# Patient Record
Sex: Female | Born: 1955 | Race: Black or African American | Hispanic: No | Smoking: Never smoker
Health system: Southern US, Community
[De-identification: ages and names within clinical notes are randomized; demographics above are authoritative.]

## PROBLEM LIST (undated history)

## (undated) DIAGNOSIS — R519 Headache, unspecified: Secondary | ICD-10-CM

## (undated) DIAGNOSIS — I10 Essential (primary) hypertension: Secondary | ICD-10-CM

## (undated) DIAGNOSIS — R51 Headache: Secondary | ICD-10-CM

## (undated) DIAGNOSIS — E7800 Pure hypercholesterolemia, unspecified: Secondary | ICD-10-CM

## (undated) DIAGNOSIS — G43909 Migraine, unspecified, not intractable, without status migrainosus: Secondary | ICD-10-CM

## (undated) HISTORY — DX: Pure hypercholesterolemia, unspecified: E78.00

## (undated) HISTORY — DX: Headache, unspecified: R51.9

## (undated) HISTORY — DX: Headache: R51

## (undated) HISTORY — DX: Migraine, unspecified, not intractable, without status migrainosus: G43.909

## (undated) HISTORY — DX: Essential (primary) hypertension: I10

## (undated) HISTORY — PX: CHOLECYSTECTOMY: SHX55

---

## 1987-09-27 HISTORY — PX: OTHER SURGICAL HISTORY: SHX169

## 2012-09-26 HISTORY — PX: OTHER SURGICAL HISTORY: SHX169

## 2013-08-27 ENCOUNTER — Encounter: Payer: Self-pay | Admitting: Internal Medicine

## 2013-08-27 ENCOUNTER — Ambulatory Visit (INDEPENDENT_AMBULATORY_CARE_PROVIDER_SITE_OTHER): Payer: BC Managed Care – PPO | Admitting: Internal Medicine

## 2013-08-27 VITALS — BP 139/72 | HR 90 | Temp 98.2°F | Resp 14 | Ht 62.5 in | Wt 126.5 lb

## 2013-08-27 DIAGNOSIS — Z23 Encounter for immunization: Secondary | ICD-10-CM

## 2013-08-27 DIAGNOSIS — M549 Dorsalgia, unspecified: Secondary | ICD-10-CM

## 2013-08-27 DIAGNOSIS — I10 Essential (primary) hypertension: Secondary | ICD-10-CM

## 2013-08-27 NOTE — Progress Notes (Unsigned)
   Subjective:    Patient ID: Brandi Gutierrez, female    DOB: 06/14/1956, 57 y.o.   MRN: [id number]  HPI: Pt here for new patient visit to establish care. SHe was recently discharged from [facility] after she underwent a cholecystectomy for treatment of gallbladder pancreatitis. She states that she has been tolerating her food without any difficulty. She has only minimal pain.    Review of Systems     Objective:   Physical Exam        Assessment & Plan:  1. Back Pain: Pt manages with NSAID's. Pt states that she has been prescribed Mobic and Flexaril and  Duexis but does no take them as she does not like how it makes feel. Pt walks for exercise.  2. HTN: BP well controlled on BP meds. Verapimil. Will check U/A fro protein.   3. Itching of feet. Pt has npo sequela of systemic disease and this is likely just a non-specific iriching secondary to dry skin.  4. Immunization: Pt had a reaction to influenza vaccine and is refusing an influenza vaccine. Agrees to take TDAP  5. Health Maintenance: colonoscopy about 7 years ago and mammogram last year. No further PAP smears since Hysterectomy in 1989.  6. S/P Cholecystectomy and gallstone Pancreatitis: Pt tolerating diet well but has rapid transit diarrhea.

## 2013-09-17 ENCOUNTER — Telehealth: Payer: Self-pay | Admitting: *Deleted

## 2013-09-17 NOTE — Telephone Encounter (Signed)
Received Medical records from previous MD

## 2013-10-09 ENCOUNTER — Encounter: Payer: Self-pay | Admitting: Internal Medicine

## 2013-10-09 ENCOUNTER — Ambulatory Visit (INDEPENDENT_AMBULATORY_CARE_PROVIDER_SITE_OTHER): Payer: BC Managed Care – PPO | Admitting: Internal Medicine

## 2013-10-09 VITALS — BP 159/82 | HR 87 | Temp 98.4°F | Resp 16 | Ht 62.25 in | Wt 132.0 lb

## 2013-10-09 DIAGNOSIS — I10 Essential (primary) hypertension: Secondary | ICD-10-CM

## 2013-10-09 DIAGNOSIS — R195 Other fecal abnormalities: Secondary | ICD-10-CM

## 2013-10-09 DIAGNOSIS — R053 Chronic cough: Secondary | ICD-10-CM | POA: Insufficient documentation

## 2013-10-09 DIAGNOSIS — Z9049 Acquired absence of other specified parts of digestive tract: Secondary | ICD-10-CM

## 2013-10-09 DIAGNOSIS — R059 Cough, unspecified: Secondary | ICD-10-CM

## 2013-10-09 DIAGNOSIS — Z9089 Acquired absence of other organs: Secondary | ICD-10-CM

## 2013-10-09 DIAGNOSIS — E781 Pure hyperglyceridemia: Secondary | ICD-10-CM | POA: Insufficient documentation

## 2013-10-09 DIAGNOSIS — G894 Chronic pain syndrome: Secondary | ICD-10-CM | POA: Insufficient documentation

## 2013-10-09 DIAGNOSIS — R05 Cough: Secondary | ICD-10-CM

## 2013-10-09 MED ORDER — METOCLOPRAMIDE HCL 5 MG PO TABS: 5.0000 mg | ORAL_TABLET | Freq: Three times a day (TID) | ORAL | Status: DC

## 2013-10-09 MED ORDER — HYDROCODONE-HOMATROPINE 5-1.5 MG PO TABS: 1.0000 | ORAL_TABLET | Freq: Two times a day (BID) | ORAL | Status: DC

## 2013-10-09 NOTE — Progress Notes (Signed)
Subjective:    Patient ID: Brandi Gutierrez, female    DOB: 05/14/1956, 58 y.o.   MRN: [id number]  HPI: Pt presents today with C/O bloating, constipation and decreased caliber of stool which has been occurring for a few months. She states that the stool  Is normal consistency but she feel that she has to strain to have a BM and when she does the caliber of stool is decreased. She has no associated abdominal pain, emesis or nausea. She does feel bloated and has an excessive amount of flatus.  Pt also has complaints of cough. Pt has had chronic cough for several years and has had an extensive work-up which revealed only that she has a build-up of mucus which causes her to have a pocketing of mucus which leads to coughing and sometimes hoarseness. She has been seen by 3 ENT's and had an endoscopic evaluation both by ENT and GI without any findings. She has also had a swallow study whic was normal. She states that antihistamines make it worse. 5-1.5 mgat although she does not like to take this medication as she does not like how it makes her feel, she takes it at night so that she can sleep.   She also has chronic pain. Pt has had chronic back pain secondary to an injury at work 2 Lumbar and 1 thoracic disc. She has been in a pain clinic and was prescribed tylenol with codeine whic according was no more effective than the Duexis. She does not want to try any opiates as she has 2 siblings who were addicted to narcotics.     Review of Systems  Constitutional: Negative.   HENT: Negative.   Eyes: Negative.   Respiratory: Positive for cough.   Cardiovascular: Negative.   Gastrointestinal:       Constipation, excessive flatus and change in caliber of stools  Endocrine: Negative.   Genitourinary: Negative.   Musculoskeletal: Positive for back pain.  Skin: Negative.   Allergic/Immunologic: Negative.   Neurological: Negative.   Hematological: Negative.   Psychiatric/Behavioral: Negative.         Objective:   Physical Exam  Constitutional: She is oriented to person, place, and time. She appears well-developed and well-nourished.  HENT:  Head: Normocephalic and atraumatic.  Eyes: Conjunctivae and EOM are normal. Pupils are equal, round, and reactive to light. No scleral icterus.  Neck: Normal range of motion. Neck supple. No JVD present. No thyromegaly present.  Cardiovascular: Normal rate and regular rhythm.  Exam reveals no gallop and no friction rub.   No murmur heard. Pulmonary/Chest: Effort normal and breath sounds normal. She has no wheezes. She has no rales.  Abdominal: Soft. Bowel sounds are normal. She exhibits no distension and no mass. There is no tenderness.  Musculoskeletal: Normal range of motion.  Neurological: She is alert and oriented to person, place, and time. No cranial nerve deficit.  Skin: Skin is warm and dry.  Psychiatric: She has a normal mood and affect. Her behavior is normal. Judgment and thought content normal.          Assessment & Plan:  1. Constipation and flatus: Pt is describing decreased gut motility and change in caliber of stools. Will give a trial of reglan and also refer to GI as the pencil thin stools are a concern for partially obstructed lumen. Pt has not noted bloody or melanotic stools. Will refer to GI. Pt requesting Dr. Loreta AveMann.  2. Chronic Cough: Pt has had chronic cough for several  years and has had an extensive work-up which revealed only that she has a build-up of mucus which causes her to have a pocketing of mucus which leads to coughing and sometimes hoarseness. She has been seen by 3 ENT's and had an endoscopic evaluation both by ENT and GI without any findings. She has also had a swallow study whic was normal. She states that antihistamines make it worse. 5-1.5 mgat although she does not like to take this medication as she does not like how it makes her feel, she takes it at night so that she can sleep. Will refill Hycodan 5-1.5mg .    3.Chronic pain: Pt has had chronic back pain secondary to an injury at work 2 Lumbar and 1 thoracic disc. She has been in a pain clinic and was prescribed tylenol with codeine whic according was no more effective than the Duexis. She does not want to try any opiates as she has 2 siblings who were addicted to narcotics.   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 Pt on Lopid 600 mg. Continue. Recheck in one year  5. HTN: BP mildly elevated but patietn feels that this is related to her pain as she reports ambulatory BP to be 140/80 usually. Will continue Dyazide and recheck on next visit when pain better controlled.  RTC: 1-2 months for annual Physical examination Labs: CMET, CBC with diff 1 week before appointment Referral: GI with Dr. Loreta Ave.

## 2013-10-10 ENCOUNTER — Telehealth: Payer: Self-pay | Admitting: *Deleted

## 2013-10-10 NOTE — Telephone Encounter (Addendum)
Faxed referral to Dr. Loreta AveMann [phone] for abnormal stool ( demo/ insurance card/office notes/ orders)

## 2013-11-25 ENCOUNTER — Telehealth: Payer: Self-pay | Admitting: Internal Medicine

## 2013-11-25 DIAGNOSIS — R053 Chronic cough: Secondary | ICD-10-CM

## 2013-11-25 DIAGNOSIS — R05 Cough: Secondary | ICD-10-CM

## 2013-11-25 MED ORDER — HYDROCODONE-HOMATROPINE 5-1.5 MG PO TABS: 1.0000 | ORAL_TABLET | Freq: Two times a day (BID) | ORAL | Status: DC

## 2013-11-25 NOTE — Telephone Encounter (Signed)
Prescription filled for Hycodan (Hydrocodone-Homatropine 5-15 mg #60.

## 2013-11-27 ENCOUNTER — Ambulatory Visit: Payer: BC Managed Care – PPO | Admitting: Internal Medicine

## 2013-12-05 ENCOUNTER — Other Ambulatory Visit: Payer: Self-pay | Admitting: Internal Medicine

## 2013-12-05 ENCOUNTER — Ambulatory Visit (INDEPENDENT_AMBULATORY_CARE_PROVIDER_SITE_OTHER): Payer: BC Managed Care – PPO | Admitting: Internal Medicine

## 2013-12-05 ENCOUNTER — Encounter: Payer: Self-pay | Admitting: Internal Medicine

## 2013-12-05 VITALS — BP 157/91 | HR 114 | Temp 98.0°F | Resp 20 | Ht 62.0 in | Wt 131.0 lb

## 2013-12-05 DIAGNOSIS — Z0000 Encounter for general adult medical examination without abnormal findings: Secondary | ICD-10-CM | POA: Insufficient documentation

## 2013-12-05 DIAGNOSIS — R0600 Dyspnea, unspecified: Secondary | ICD-10-CM | POA: Insufficient documentation

## 2013-12-05 DIAGNOSIS — R9431 Abnormal electrocardiogram [ECG] [EKG]: Secondary | ICD-10-CM

## 2013-12-05 DIAGNOSIS — I10 Essential (primary) hypertension: Secondary | ICD-10-CM

## 2013-12-05 DIAGNOSIS — R0609 Other forms of dyspnea: Secondary | ICD-10-CM | POA: Insufficient documentation

## 2013-12-05 DIAGNOSIS — M519 Unspecified thoracic, thoracolumbar and lumbosacral intervertebral disc disorder: Secondary | ICD-10-CM | POA: Insufficient documentation

## 2013-12-05 DIAGNOSIS — R7309 Other abnormal glucose: Secondary | ICD-10-CM

## 2013-12-05 DIAGNOSIS — R0989 Other specified symptoms and signs involving the circulatory and respiratory systems: Secondary | ICD-10-CM

## 2013-12-05 DIAGNOSIS — E876 Hypokalemia: Secondary | ICD-10-CM

## 2013-12-05 DIAGNOSIS — R739 Hyperglycemia, unspecified: Secondary | ICD-10-CM | POA: Insufficient documentation

## 2013-12-05 DIAGNOSIS — E785 Hyperlipidemia, unspecified: Secondary | ICD-10-CM

## 2013-12-05 DIAGNOSIS — E559 Vitamin D deficiency, unspecified: Secondary | ICD-10-CM | POA: Insufficient documentation

## 2013-12-05 LAB — CBC WITH DIFFERENTIAL/PLATELET
Basophils Absolute: 0 10*3/uL (ref 0.0–0.1)
Basophils Relative: 0 % (ref 0–1)
Eosinophils Absolute: 0.1 10*3/uL (ref 0.0–0.7)
Eosinophils Relative: 2 % (ref 0–5)
HCT: 42.3 % (ref 36.0–46.0)
Hemoglobin: 15.2 g/dL — ABNORMAL HIGH (ref 12.0–15.0)
LYMPHS ABS: 2.1 10*3/uL (ref 0.7–4.0)
LYMPHS PCT: 29 % (ref 12–46)
MCH: 31.9 pg (ref 26.0–34.0)
MCHC: 35.9 g/dL (ref 30.0–36.0)
MCV: 88.7 fL (ref 78.0–100.0)
Monocytes Absolute: 0.6 10*3/uL (ref 0.1–1.0)
Monocytes Relative: 8 % (ref 3–12)
NEUTROS ABS: 4.5 10*3/uL (ref 1.7–7.7)
Neutrophils Relative %: 61 % (ref 43–77)
PLATELETS: 223 10*3/uL (ref 150–400)
RBC: 4.77 MIL/uL (ref 3.87–5.11)
RDW: 13.5 % (ref 11.5–15.5)
WBC: 7.4 10*3/uL (ref 4.0–10.5)

## 2013-12-05 LAB — HEMOGLOBIN A1C
Hgb A1c MFr Bld: 5.3 % (ref <5.7)
MEAN PLASMA GLUCOSE: 105 mg/dL (ref <117)

## 2013-12-05 NOTE — Progress Notes (Signed)
   Subjective:    Patient ID: Brandi Gutierrez, female    DOB: 10/31/1955, 58 y.o.   MRN: [id number]  HPI: Pt here for annual Physical examination. She also reports DOE and occasional SOB. She denies CP, pedal edema or orthopnea. She has no history of PAD but does have risk factors of hyperlipidemia and HTN.   Pt also reports that her back pain has increased and is now agreeable to see an orthopedic back specialist. She is requesting referral to Dr. Venita Lickahari Brooks.  She had tried to stop taking the Hycodan but the coughing has increased an had begin preventing sleep until Hycodan was resumed. The  Cough is non-productive and unchanged in quality and character from previously. She has had no F/C.  Constipation has improved since starting Linzess. Pt state that she was started on Dexilant by Dr. Loreta AveMann.     Review of Systems  Constitutional: Negative.  Negative for fever and chills.  HENT: Negative.   Eyes: Negative.   Respiratory: Positive for cough (Chronic, non productive) and shortness of breath (DOE).   Cardiovascular: Negative.  Negative for chest pain, palpitations and leg swelling.  Gastrointestinal: Negative.   Endocrine: Negative.   Genitourinary: Negative.   Musculoskeletal: Positive for back pain and myalgias.  Skin: Negative.   Allergic/Immunologic: Negative.   Neurological: Negative.  Negative for dizziness, speech difficulty, weakness and light-headedness.  Hematological: Negative.   Psychiatric/Behavioral: Negative.        Objective:   Physical Exam  Vitals reviewed. Constitutional: She is oriented to person, place, and time. She appears well-developed and well-nourished.  Breast show no masses or nipple discharge  HENT:  Head: Normocephalic and atraumatic.  Eyes: Conjunctivae and EOM are normal. Pupils are equal, round, and reactive to light.  Neck: Normal range of motion. Neck supple. No JVD present. No thyromegaly present.  Cardiovascular: Normal rate and regular  rhythm.  Exam reveals no gallop and no friction rub.   No murmur heard. Pulmonary/Chest: Effort normal and breath sounds normal. She has no wheezes. She has no rales.  Abdominal: Soft. Bowel sounds are normal. She exhibits no distension and no mass. There is no tenderness.  Musculoskeletal: Normal range of motion.  Neurological: She is alert and oriented to person, place, and time. No cranial nerve deficit.  Skin: Skin is warm and dry.  Psychiatric: She has a normal mood and affect. Her behavior is normal. Judgment and thought content normal.          Assessment & Plan:  1. HTN: Pt states that she has white coat HTN  And that BP are running 120's /80's. She will record home BP and bring them in on her next visit. Check lipid panel and 12 lead EKG   2. Constipation: Pt states that her constipation has improved with use of Linzess.   3. Hyperlipidemia: Check lipids  4. Back Pain: Chronic pain secondary to injury to lumbar and thoracic discs. Will refer to Dr. Shon BatonBrooks Orthopedic back specialist per patient request..  5. Chronic Cough: Continue Hycodan.  6. Abnormal EKG: In light of risk factors and symptoms of SOD+B and DOE will refer to Cardiology. Pt requesting Dr. Jacinto HalimGanji. Will refer.  6. SOB: Pt has DOE and occasional SOB. Her EKG shows findings consistent  7. Annual PAP and Pelvic:   Labs: CMET, Hemoglobin A1c, Lipid, TSH, CBC with diff

## 2013-12-06 LAB — COMPLETE METABOLIC PANEL WITH GFR
ALBUMIN: 4.7 g/dL (ref 3.5–5.2)
ALT: 11 U/L (ref 0–35)
AST: 21 U/L (ref 0–37)
Alkaline Phosphatase: 78 U/L (ref 39–117)
BUN: 13 mg/dL (ref 6–23)
CHLORIDE: 91 meq/L — AB (ref 96–112)
CO2: 20 mEq/L (ref 19–32)
Calcium: 9.9 mg/dL (ref 8.4–10.5)
Creat: 0.94 mg/dL (ref 0.50–1.10)
GFR, EST NON AFRICAN AMERICAN: 68 mL/min
GFR, Est African American: 78 mL/min
GLUCOSE: 91 mg/dL (ref 70–99)
Potassium: 3.4 mEq/L — ABNORMAL LOW (ref 3.5–5.3)
Sodium: 130 mEq/L — ABNORMAL LOW (ref 135–145)
Total Bilirubin: 0.4 mg/dL (ref 0.2–1.2)
Total Protein: 8.3 g/dL (ref 6.0–8.3)

## 2013-12-06 LAB — COMPREHENSIVE METABOLIC PANEL
ALBUMIN: 4.7 g/dL (ref 3.5–5.2)
ALK PHOS: 73 U/L (ref 39–117)
ALT: 13 U/L (ref 0–35)
AST: 18 U/L (ref 0–37)
BUN: 13 mg/dL (ref 6–23)
CO2: 16 mEq/L — ABNORMAL LOW (ref 19–32)
Calcium: 10.1 mg/dL (ref 8.4–10.5)
Chloride: 93 mEq/L — ABNORMAL LOW (ref 96–112)
Creat: 0.87 mg/dL (ref 0.50–1.10)
Glucose, Bld: 47 mg/dL — ABNORMAL LOW (ref 70–99)
POTASSIUM: 3 meq/L — AB (ref 3.5–5.3)
SODIUM: 137 meq/L (ref 135–145)
TOTAL PROTEIN: 8.6 g/dL — AB (ref 6.0–8.3)
Total Bilirubin: 0.4 mg/dL (ref 0.2–1.2)

## 2013-12-06 LAB — TSH
TSH: 1.517 u[IU]/mL (ref 0.350–4.500)
TSH: 1.435 u[IU]/mL (ref 0.350–4.500)

## 2013-12-06 LAB — VITAMIN D 25 HYDROXY (VIT D DEFICIENCY, FRACTURES): Vit D, 25-Hydroxy: 59 ng/mL (ref 30–89)

## 2013-12-06 MED ORDER — POTASSIUM CHLORIDE 20 MEQ PO PACK: 20.0000 meq | PACK | Freq: Two times a day (BID) | ORAL | Status: DC

## 2013-12-06 NOTE — Addendum Note (Signed)
Addended by: Marthann SchillerMATTHEWS, MICHELLE A on: 12/06/2013 02:08 PM   Modules accepted: Orders

## 2013-12-16 ENCOUNTER — Encounter: Payer: Self-pay | Admitting: Internal Medicine

## 2013-12-24 ENCOUNTER — Telehealth: Payer: Self-pay | Admitting: Internal Medicine

## 2013-12-24 DIAGNOSIS — R05 Cough: Secondary | ICD-10-CM

## 2013-12-24 DIAGNOSIS — R053 Chronic cough: Secondary | ICD-10-CM

## 2013-12-24 MED ORDER — HYDROCODONE-HOMATROPINE 5-1.5 MG PO TABS: 1.0000 | ORAL_TABLET | Freq: Two times a day (BID) | ORAL | Status: DC

## 2013-12-24 NOTE — Telephone Encounter (Signed)
Prescription given for Hicodan #60 tabs.

## 2013-12-26 ENCOUNTER — Telehealth: Payer: Self-pay | Admitting: Internal Medicine

## 2013-12-26 NOTE — Telephone Encounter (Signed)
Patient would like to know why she has been prescribed potassium chloride. Please call.

## 2014-01-13 ENCOUNTER — Other Ambulatory Visit: Payer: Self-pay | Admitting: Internal Medicine

## 2014-01-13 DIAGNOSIS — E872 Acidosis, unspecified: Secondary | ICD-10-CM

## 2014-01-14 ENCOUNTER — Telehealth: Payer: Self-pay

## 2014-01-14 ENCOUNTER — Other Ambulatory Visit: Payer: BC Managed Care – PPO

## 2014-01-14 DIAGNOSIS — E872 Acidosis, unspecified: Secondary | ICD-10-CM

## 2014-01-14 DIAGNOSIS — E876 Hypokalemia: Secondary | ICD-10-CM

## 2014-01-14 LAB — BASIC METABOLIC PANEL
BUN: 9 mg/dL (ref 6–23)
CO2: 26 mEq/L (ref 19–32)
Calcium: 9.5 mg/dL (ref 8.4–10.5)
Chloride: 95 mEq/L — ABNORMAL LOW (ref 96–112)
Creat: 1.04 mg/dL (ref 0.50–1.10)
GLUCOSE: 132 mg/dL — AB (ref 70–99)
POTASSIUM: 3.1 meq/L — AB (ref 3.5–5.3)
Sodium: 134 mEq/L — ABNORMAL LOW (ref 135–145)

## 2014-01-14 NOTE — Telephone Encounter (Signed)
01/14/2014 8:27 AM Phone (Outgoing) Brandi Gutierrez, Brandi Gutierrez (Self) [phone] (H) Left Message- Pt was contacted on (01/14/2014) and VM was left asking her to come in to have her labs performed. Pt was asked to contact front office to set up this Appointment as soon as she could.

## 2014-01-14 NOTE — Telephone Encounter (Signed)
Pt returned call to office and will be comming in for labs as requested today.

## 2014-01-15 ENCOUNTER — Other Ambulatory Visit: Payer: Self-pay | Admitting: Internal Medicine

## 2014-01-15 DIAGNOSIS — R053 Chronic cough: Secondary | ICD-10-CM

## 2014-01-15 DIAGNOSIS — R05 Cough: Secondary | ICD-10-CM

## 2014-01-16 MED ORDER — HYDROCODONE-HOMATROPINE 5-1.5 MG PO TABS: 1.0000 | ORAL_TABLET | Freq: Two times a day (BID) | ORAL | Status: DC

## 2014-01-16 MED ORDER — POTASSIUM CHLORIDE 20 MEQ PO PACK: 40.0000 meq | PACK | Freq: Two times a day (BID) | ORAL | Status: DC

## 2014-01-16 NOTE — Telephone Encounter (Signed)
Requesting refill for hydrocodone-homatropine. LOV 12/06/2013

## 2014-01-16 NOTE — Telephone Encounter (Signed)
Prescription refilled for Hycodan.

## 2014-01-17 ENCOUNTER — Telehealth: Payer: Self-pay

## 2014-01-17 ENCOUNTER — Encounter: Payer: Self-pay | Admitting: Internal Medicine

## 2014-01-17 NOTE — Telephone Encounter (Signed)
Pt contacted office and lab results were given Pt stated verbal understanding of them. Pt was alerted that she needs to make a lab appointment to be seen with in next 2 weeks.Pt was alerted to her Rx being faxed over to her local Pharm.

## 2014-01-21 ENCOUNTER — Telehealth: Payer: Self-pay | Admitting: Internal Medicine

## 2014-01-21 ENCOUNTER — Telehealth: Payer: Self-pay

## 2014-01-21 NOTE — Telephone Encounter (Signed)
Dr. Loreta AveMann has had no communication with me. Please call Dr. Kenna GilbertMann's office and ask Dr. Loreta AveMann to give me a call if she has  a recommendation that she would like to discuss.

## 2014-01-21 NOTE — Telephone Encounter (Signed)
Patient was advised by Dr. Christean LeafMann(Gastro) to get a referral from her PCP to see an ENT specialist.

## 2014-01-27 ENCOUNTER — Other Ambulatory Visit: Payer: BC Managed Care – PPO

## 2014-01-27 ENCOUNTER — Telehealth: Payer: Self-pay

## 2014-01-27 ENCOUNTER — Other Ambulatory Visit: Payer: Self-pay | Admitting: Internal Medicine

## 2014-01-27 LAB — MAGNESIUM: MAGNESIUM: 2.1 mg/dL (ref 1.5–2.5)

## 2014-01-27 NOTE — Telephone Encounter (Signed)
Pt was in office today (01/27/2014) for labs she was asked about a referal to see an ENT. Pt stated to plz discard last call for this,Pt states she no longer needs referal after all to be seen.

## 2014-01-27 NOTE — Progress Notes (Addendum)
Magnesium level drawn per order.

## 2014-01-28 ENCOUNTER — Other Ambulatory Visit: Payer: BC Managed Care – PPO

## 2014-02-14 ENCOUNTER — Other Ambulatory Visit: Payer: Self-pay | Admitting: Internal Medicine

## 2014-02-14 DIAGNOSIS — R053 Chronic cough: Secondary | ICD-10-CM

## 2014-02-14 DIAGNOSIS — R05 Cough: Secondary | ICD-10-CM

## 2014-02-18 ENCOUNTER — Telehealth: Payer: Self-pay | Admitting: Internal Medicine

## 2014-02-18 NOTE — Telephone Encounter (Signed)
Left voicemail for patient to call to confirm appointment for 02/21/14.

## 2014-02-21 ENCOUNTER — Encounter: Payer: Self-pay | Admitting: Family Medicine

## 2014-02-21 ENCOUNTER — Ambulatory Visit (INDEPENDENT_AMBULATORY_CARE_PROVIDER_SITE_OTHER): Payer: BC Managed Care – PPO | Admitting: Family Medicine

## 2014-02-21 VITALS — BP 150/100 | HR 95 | Temp 98.4°F | Resp 20 | Wt 132.0 lb

## 2014-02-21 DIAGNOSIS — R232 Flushing: Secondary | ICD-10-CM

## 2014-02-21 DIAGNOSIS — R059 Cough, unspecified: Secondary | ICD-10-CM

## 2014-02-21 DIAGNOSIS — E785 Hyperlipidemia, unspecified: Secondary | ICD-10-CM

## 2014-02-21 DIAGNOSIS — E559 Vitamin D deficiency, unspecified: Secondary | ICD-10-CM

## 2014-02-21 DIAGNOSIS — Z1231 Encounter for screening mammogram for malignant neoplasm of breast: Secondary | ICD-10-CM

## 2014-02-21 DIAGNOSIS — I10 Essential (primary) hypertension: Secondary | ICD-10-CM

## 2014-02-21 DIAGNOSIS — R053 Chronic cough: Secondary | ICD-10-CM

## 2014-02-21 DIAGNOSIS — R05 Cough: Secondary | ICD-10-CM

## 2014-02-21 DIAGNOSIS — N951 Menopausal and female climacteric states: Secondary | ICD-10-CM

## 2014-02-21 MED ORDER — ESTROGENS CONJUGATED 0.625 MG PO TABS: 0.6250 mg | ORAL_TABLET | Freq: Every day | ORAL | Status: DC

## 2014-02-21 MED ORDER — HYDROCODONE-HOMATROPINE 5-1.5 MG PO TABS: 1.0000 | ORAL_TABLET | Freq: Two times a day (BID) | ORAL | Status: DC

## 2014-02-21 NOTE — Progress Notes (Signed)
   Subjective:    Patient ID: Laresa Cramblit, female    DOB: 08-Jan-1956, 58 y.o.   MRN: [id number]  HPI  Pt also has complaints chronic cough. Pt has had chronic cough for twenty years and has had an extensive work-up that revealed mucous pocketing that leads to a chronic cough and hoarseness. She has been seen by 3 ENT's and is currently followed by Dr. Christell Constant at [facility]. She has also had an endoscopic evaluation by both ENT and Dr. Loreta Ave, GI, with negative findings. Cough is worsened by antihistamines and minimally relieved by Hycodan twice daily, last taken on 02/20/2014. S    Review of Systems  Constitutional: Negative for diaphoresis.  HENT: Negative.   Eyes: Negative.   Respiratory: Positive for cough and choking.   Cardiovascular: Negative.   Gastrointestinal: Negative.   Endocrine: Negative.   Genitourinary: Negative.   Musculoskeletal: Negative.   Skin: Negative.   Allergic/Immunologic: Negative.   Neurological: Negative.   Hematological: Negative.   Psychiatric/Behavioral: Negative.        Objective:   Physical Exam  Constitutional: She is oriented to person, place, and time. She appears well-developed and well-nourished.  HENT:  Head: Normocephalic and atraumatic.  Right Ear: External ear normal.  Eyes: Conjunctivae are normal. Pupils are equal, round, and reactive to light.  Neck: Normal range of motion. Neck supple.  Cardiovascular: Normal rate, regular rhythm and normal heart sounds.   Pulmonary/Chest: Effort normal and breath sounds normal. No apnea and not tachypneic. No respiratory distress. She has no decreased breath sounds. She has no rhonchi.  Abdominal: Soft. Bowel sounds are normal.  Musculoskeletal: Normal range of motion.  Neurological: She is alert and oriented to person, place, and time.  Skin: Skin is warm and dry.          Assessment & Plan:   1. Chronic Cough: Pt complaining of chronic cough for several years and has had several  extensive work-ups. Work-ups have shown that she has a build-up of mucus which causes her to have a pocketing of mucus which leads to chronic cough. She has been seen by three ENT's and is currently followed by Dr. Christell Constant at [facility]. She had an endoscopic evaluation both by ENT and recently by Dr. Loreta Ave, GI without any findings. In addition, there was a swallow study which was within normal limits. Patient reports that antihistamines have failed in the past and have made cough symptoms worse. Re-ordered Hycodan 5-1.5 mg, which is the only medication that has decreased persistent coughing.    2. Hypertriglycerindemia:  Continue current medication regimen   3. HTN: BP stable on current medication regimen. BP mildly elevated during appt. Patient has been experiencing coughing during both manual and automatic bp checks.   4. Hot flashes:  Patient is post menopausal and has a history of hot flashes. Will continue Premarin as previously prescribed. Reinforced education on potential side effects of Premarin.  5. Vitamin D deficiency: Continue Vitamin D tablets 1000 units twice daily.   Preventative: Reports that it has been greater than 1 year since last mammogram. Will refer for screening mammogram  Massie Maroon

## 2014-02-24 NOTE — Telephone Encounter (Signed)
Medication refill request for Hydrocodone-Homatropine 5-1.5 MG TABS / LOV 02/21/2014

## 2014-02-26 DIAGNOSIS — R232 Flushing: Secondary | ICD-10-CM | POA: Insufficient documentation

## 2014-02-26 DIAGNOSIS — Z1231 Encounter for screening mammogram for malignant neoplasm of breast: Secondary | ICD-10-CM | POA: Insufficient documentation

## 2014-03-03 ENCOUNTER — Telehealth: Payer: Self-pay

## 2014-03-03 NOTE — Telephone Encounter (Signed)
Pt has Appointment per NP request[facility] Routine Mamo.

## 2014-03-10 ENCOUNTER — Ambulatory Visit: Payer: BC Managed Care – PPO | Admitting: Internal Medicine

## 2014-03-11 ENCOUNTER — Telehealth: Payer: Self-pay

## 2014-03-11 ENCOUNTER — Ambulatory Visit
Admission: RE | Admit: 2014-03-11 | Discharge: 2014-03-11 | Disposition: A | Discharge status: Home / Self Care | Payer: Federal, State, Local not specified - PPO | Source: Ambulatory Visit | Attending: Family Medicine | Admitting: Family Medicine

## 2014-03-11 DIAGNOSIS — Z1231 Encounter for screening mammogram for malignant neoplasm of breast: Secondary | ICD-10-CM

## 2014-03-11 NOTE — Telephone Encounter (Signed)
Pt contacted office today stating she was having nausa issues as well as loose stool isssues.Pt stated she had not been able to keep anything down except for crackers.When asked how long Sx's had been ongoing Pt stated x's 1 wk. Pt stated she went to Urgent care on Friday 03/07/2014 for this.Pt states she had a mild headache but B/P was 129/82 as of today.Pt was encourged to come in to be seen by NP on tomorrow,Pt refused and wanted to see MD only.Pt made appointment for 03/13/2014@ 3P.M..Before hanging up w/ Pt she was again advised of an appointment she could have as soon as tomorrow,Again she refused.

## 2014-03-12 ENCOUNTER — Other Ambulatory Visit: Payer: Self-pay | Admitting: Internal Medicine

## 2014-03-12 ENCOUNTER — Telehealth: Payer: Self-pay

## 2014-03-12 ENCOUNTER — Encounter: Payer: Self-pay | Admitting: Internal Medicine

## 2014-03-12 ENCOUNTER — Ambulatory Visit (INDEPENDENT_AMBULATORY_CARE_PROVIDER_SITE_OTHER): Payer: Federal, State, Local not specified - PPO | Admitting: Internal Medicine

## 2014-03-12 VITALS — BP 143/71 | HR 100 | Temp 98.0°F | Resp 20 | Wt 127.0 lb

## 2014-03-12 DIAGNOSIS — F3289 Other specified depressive episodes: Secondary | ICD-10-CM

## 2014-03-12 DIAGNOSIS — R51 Headache: Secondary | ICD-10-CM

## 2014-03-12 DIAGNOSIS — R112 Nausea with vomiting, unspecified: Secondary | ICD-10-CM

## 2014-03-12 DIAGNOSIS — R519 Headache, unspecified: Secondary | ICD-10-CM | POA: Insufficient documentation

## 2014-03-12 DIAGNOSIS — R42 Dizziness and giddiness: Secondary | ICD-10-CM

## 2014-03-12 DIAGNOSIS — F329 Major depressive disorder, single episode, unspecified: Secondary | ICD-10-CM

## 2014-03-12 DIAGNOSIS — F32A Depression, unspecified: Secondary | ICD-10-CM | POA: Insufficient documentation

## 2014-03-12 LAB — CBC WITH DIFFERENTIAL/PLATELET
BASOS ABS: 0 10*3/uL (ref 0.0–0.1)
Basophils Relative: 0 % (ref 0–1)
EOS PCT: 2 % (ref 0–5)
Eosinophils Absolute: 0.2 10*3/uL (ref 0.0–0.7)
HCT: 43.9 % (ref 36.0–46.0)
Hemoglobin: 15.8 g/dL — ABNORMAL HIGH (ref 12.0–15.0)
LYMPHS PCT: 28 % (ref 12–46)
Lymphs Abs: 2.3 10*3/uL (ref 0.7–4.0)
MCH: 32.1 pg (ref 26.0–34.0)
MCHC: 36 g/dL (ref 30.0–36.0)
MCV: 89.2 fL (ref 78.0–100.0)
Monocytes Absolute: 0.6 10*3/uL (ref 0.1–1.0)
Monocytes Relative: 8 % (ref 3–12)
NEUTROS PCT: 62 % (ref 43–77)
Neutro Abs: 5 10*3/uL (ref 1.7–7.7)
PLATELETS: 211 10*3/uL (ref 150–400)
RBC: 4.92 MIL/uL (ref 3.87–5.11)
RDW: 13.7 % (ref 11.5–15.5)
WBC: 8.1 10*3/uL (ref 4.0–10.5)

## 2014-03-12 MED ORDER — DULOXETINE HCL 30 MG PO CPEP: 30.0000 mg | ORAL_CAPSULE | Freq: Every day | ORAL | Status: DC

## 2014-03-12 NOTE — Telephone Encounter (Signed)
Pt's [facility] [address] [place] [address] Ph#[phone] Sat.03/15/2014@3  P.M. Pt has been notified of date as well as time.

## 2014-03-12 NOTE — Progress Notes (Signed)
Subjective:    Patient ID: Brandi Gutierrez, female    DOB: 04/21/1956, 58 y.o.   MRN: [id number]  HPI: Pt presents with onset of constant headache  Of fluctuating intensity x 1 week. She describes the H/A as throbbing and localized to the crown of her head and radiating down right side of neck. H/A at it's worst is 9/10 and is currently 8/10. She cannot identify any palliative factor but H/A is worse with coughing. H/A is associated with nausea which came on 1 day after the H/A started. The nausea adn headaches are constant and intensifies with cough or upright position. Pt does have a history of headaches but reports that this is unlike her usual migraines.   Pt also having  Post-prandial emesis associated with solid meals. She is able to tolerate liquids without difficulty. Her last meal was yesterday and so was her last emesis.    Pt also having dizziness which is constant and patient feels as though she is spinning. Pt denies any tinnitus, disequilibrium, photophobia or changes in vision.  Pt was seen in an Urgent Care and found to have an elevated BP. She was  given a new BP medication and discontinued Verapamil. She is still taking Dyazide.  Pt continues to complain about cough. She states that the cough has worsened in the last month. Pt self referred to an ENT on the advice of Dr. Loreta AveMann. Pt had already had 3 thorough evaluations in the past. She reports that the ENT had no new findings and concluded that it is related to her GERD. On further probing, patient was able to give an exact date of the start of her cough, but was unable to give the date of her divorce. She expresses grief over the loss of her livelihood and he independence. She is currently living with her sister and is unsure that she is welcome in her sister's home although she has had no indication that she is unwelcome.   Review of Systems  HENT: Negative for congestion, ear discharge, hearing loss, sinus pressure and tinnitus.    Eyes: Negative for photophobia and visual disturbance.  Respiratory: Negative for shortness of breath and wheezing.   Cardiovascular: Negative for palpitations.  Gastrointestinal: Positive for nausea and vomiting.  Endocrine: Negative for cold intolerance and heat intolerance.  Genitourinary: Negative for urgency.  Musculoskeletal: Positive for neck pain. Negative for neck stiffness.       Objective:   Physical Exam  Constitutional: She is oriented to person, place, and time. She appears well-developed and well-nourished.  HENT:  Head: Normocephalic and atraumatic.  Eyes: Conjunctivae and EOM are normal. Pupils are equal, round, and reactive to light. No scleral icterus.  Neck: Normal range of motion. Neck supple. No JVD present. No thyromegaly present.  Cardiovascular: Normal rate and regular rhythm.  Exam reveals no gallop and no friction rub.   No murmur heard. Pulmonary/Chest: Effort normal and breath sounds normal. She has no wheezes. She has no rales.  Abdominal: Soft. Bowel sounds are normal. She exhibits no distension and no mass. There is no tenderness.  Musculoskeletal: Normal range of motion.  Neurological: She is alert and oriented to person, place, and time. She has normal reflexes. No cranial nerve deficit. She exhibits normal muscle tone. Coordination normal.  No nystagmus present  Skin: Skin is warm and dry.  Psychiatric: She has a normal mood and affect. Her behavior is normal. Judgment and thought content normal.  Assessment & Plan:  1. Headaches: The headache is associated with nausea and emesis and also worsens with cough and change in position. These associated features raise the suspicion for brain tumor despite the absence of focal neurological changes. A review of prior MRI brain showed a small well defined T2/FLAIR signal in the frontal   I will obtain a MRI brain to ensure no intracranial lesions.  2.Dizziness: Non-specific.  Ms. Mayford KnifeWilliams has no  focal deficits and her dizziness is nonspecific. I personally performed orthostatic vital signs and they were negative. I will check her electrolytes and Hb/Hct to ensure no abnormalities that could contribute. I do feel that she is depressed and would benefit from psychotherapy. The dizziness is not interfering with her ADL's or daily activity (she drove herself here).   3. Nausea/Vomiting: She does not appear to be dehydrated clinically. She states that the Reglan help only minimally. Will prescribe Zofran as Phenergan would increase likeliness of extrapyramidal symptoms.   4. Depression: Pt has mild depression. Will start on Cymbalta and refer for counseling. Advised patient of side effects. Will re-assess in one month.  5. HTN: BP just only mildly elevated today. Will continue on cartia-XT.  Labs: CMET, Mg, CBC with diff  RTC 1 month.

## 2014-03-12 NOTE — Telephone Encounter (Signed)
Pt was contacted and VM was left asking her to call office with responce on if she is able to come in and be seen as of this morning per request of Dr. Ashley RoyaltyMatthews.

## 2014-03-13 ENCOUNTER — Telehealth: Payer: Self-pay

## 2014-03-13 ENCOUNTER — Ambulatory Visit: Payer: Federal, State, Local not specified - PPO | Admitting: Internal Medicine

## 2014-03-13 ENCOUNTER — Telehealth: Payer: Self-pay | Admitting: Internal Medicine

## 2014-03-13 LAB — COMPREHENSIVE METABOLIC PANEL
ALBUMIN: 4.7 g/dL (ref 3.5–5.2)
ALT: 10 U/L (ref 0–35)
AST: 15 U/L (ref 0–37)
Alkaline Phosphatase: 89 U/L (ref 39–117)
BILIRUBIN TOTAL: 0.6 mg/dL (ref 0.2–1.2)
BUN: 13 mg/dL (ref 6–23)
CHLORIDE: 92 meq/L — AB (ref 96–112)
CO2: 26 meq/L (ref 19–32)
Calcium: 10.4 mg/dL (ref 8.4–10.5)
Creat: 1.08 mg/dL (ref 0.50–1.10)
Glucose, Bld: 115 mg/dL — ABNORMAL HIGH (ref 70–99)
POTASSIUM: 3.1 meq/L — AB (ref 3.5–5.3)
SODIUM: 134 meq/L — AB (ref 135–145)
TOTAL PROTEIN: 8.3 g/dL (ref 6.0–8.3)

## 2014-03-13 LAB — MAGNESIUM: MAGNESIUM: 2 mg/dL (ref 1.5–2.5)

## 2014-03-13 NOTE — Telephone Encounter (Signed)
Call Documentation     Wende NeighborsCharlene D Thomas at 03/13/2014 3:23 PM     Status: Signed        Myrtice LauthKeith Funderburk with [employer] called regarding patient referral. He can be reached at [phone]   K. Funderburk returned call to office regarding above named Pt.Mr. Lennette BihariFunderburk states he will be reaching out to Ms. Hubers sometime this Evening to speak about getting her into the program.Pt was contacted and VM was left leaving info of impending call.

## 2014-03-13 NOTE — Telephone Encounter (Signed)
Myrtice LauthKeith Funderburk with [employer] called regarding patient referral. He can be reached at [phone].

## 2014-03-14 ENCOUNTER — Telehealth: Payer: Self-pay | Admitting: Internal Medicine

## 2014-03-14 DIAGNOSIS — R05 Cough: Secondary | ICD-10-CM

## 2014-03-14 DIAGNOSIS — R053 Chronic cough: Secondary | ICD-10-CM

## 2014-03-14 MED ORDER — HYDROCODONE-HOMATROPINE 5-1.5 MG PO TABS: 1.0000 | ORAL_TABLET | Freq: Three times a day (TID) | ORAL | Status: DC | PRN

## 2014-03-14 NOTE — Telephone Encounter (Signed)
Prescription filled for Hycodan #90 tabs.

## 2014-03-15 ENCOUNTER — Ambulatory Visit (HOSPITAL_BASED_OUTPATIENT_CLINIC_OR_DEPARTMENT_OTHER)
Admission: RE | Admit: 2014-03-15 | Discharge: 2014-03-15 | Disposition: A | Discharge status: Home / Self Care | Payer: Federal, State, Local not specified - PPO | Source: Ambulatory Visit | Attending: Internal Medicine | Admitting: Internal Medicine

## 2014-03-15 DIAGNOSIS — R519 Headache, unspecified: Secondary | ICD-10-CM

## 2014-03-15 DIAGNOSIS — R51 Headache: Secondary | ICD-10-CM | POA: Insufficient documentation

## 2014-03-15 MED ORDER — GADOBENATE DIMEGLUMINE 529 MG/ML IV SOLN: 10.0000 mL | Freq: Once | INTRAVENOUS | Status: AC | PRN

## 2014-03-17 NOTE — Progress Notes (Signed)
Patient ID: Brandi DistanceKathleen Standing, female   DOB: 12/29/1955, 58 y.o.   MRN: [id number] MRI reviewed and no suspicious findings noted. Pt however continues to have hypokalemia will continue with replacement but will proceed with TTKG measurement when she returns from vacation.

## 2014-04-01 ENCOUNTER — Telehealth: Payer: Self-pay | Admitting: Internal Medicine

## 2014-04-01 NOTE — Telephone Encounter (Signed)
Left voicemail for patient advising of appointment date change to 04/23/14, 4:15pm.

## 2014-04-04 ENCOUNTER — Other Ambulatory Visit: Payer: Self-pay | Admitting: Internal Medicine

## 2014-04-10 ENCOUNTER — Ambulatory Visit: Payer: Federal, State, Local not specified - PPO | Admitting: Internal Medicine

## 2014-04-21 ENCOUNTER — Telehealth: Payer: Self-pay | Admitting: Internal Medicine

## 2014-04-21 DIAGNOSIS — R05 Cough: Secondary | ICD-10-CM

## 2014-04-21 DIAGNOSIS — R053 Chronic cough: Secondary | ICD-10-CM

## 2014-04-21 MED ORDER — HYDROCODONE-HOMATROPINE 5-1.5 MG PO TABS: 1.0000 | ORAL_TABLET | Freq: Three times a day (TID) | ORAL | Status: DC | PRN

## 2014-04-21 MED ORDER — DILTIAZEM HCL ER COATED BEADS 300 MG PO CP24: 300.0000 mg | ORAL_CAPSULE | Freq: Every day | ORAL | Status: DC

## 2014-04-21 NOTE — Telephone Encounter (Signed)
Refill Request for Hydrocodone-Homatropine. LOV 03/12/2014.   I have refilled the Cardizem and sent to Pharmacy.

## 2014-04-21 NOTE — Telephone Encounter (Signed)
Prescription filled for Hycodan #90 tabs. 

## 2014-04-22 ENCOUNTER — Other Ambulatory Visit: Payer: Self-pay | Admitting: Internal Medicine

## 2014-04-22 DIAGNOSIS — R053 Chronic cough: Secondary | ICD-10-CM

## 2014-04-22 DIAGNOSIS — R05 Cough: Secondary | ICD-10-CM

## 2014-04-22 NOTE — Telephone Encounter (Signed)
Pt requesting a refill on Hycodan. LOV 03/12/2014. Please advise. Thanks!

## 2014-04-22 NOTE — Telephone Encounter (Signed)
Already completed. Check in the drawer.

## 2014-04-22 NOTE — Telephone Encounter (Signed)
Cannot be electronically sent it has Hydrocodone in it (a Schedule III drug)

## 2014-04-23 ENCOUNTER — Ambulatory Visit: Payer: Federal, State, Local not specified - PPO | Admitting: Internal Medicine

## 2014-05-22 ENCOUNTER — Ambulatory Visit (INDEPENDENT_AMBULATORY_CARE_PROVIDER_SITE_OTHER): Payer: Federal, State, Local not specified - PPO | Admitting: Internal Medicine

## 2014-05-22 VITALS — BP 154/69 | HR 84 | Temp 98.2°F | Resp 14 | Ht 62.0 in | Wt 128.0 lb

## 2014-05-22 DIAGNOSIS — R059 Cough, unspecified: Secondary | ICD-10-CM

## 2014-05-22 DIAGNOSIS — R7309 Other abnormal glucose: Secondary | ICD-10-CM

## 2014-05-22 DIAGNOSIS — R053 Chronic cough: Secondary | ICD-10-CM

## 2014-05-22 DIAGNOSIS — E876 Hypokalemia: Secondary | ICD-10-CM

## 2014-05-22 DIAGNOSIS — R05 Cough: Secondary | ICD-10-CM

## 2014-05-22 DIAGNOSIS — R739 Hyperglycemia, unspecified: Secondary | ICD-10-CM

## 2014-05-22 DIAGNOSIS — I10 Essential (primary) hypertension: Secondary | ICD-10-CM

## 2014-05-22 DIAGNOSIS — B029 Zoster without complications: Secondary | ICD-10-CM

## 2014-05-22 LAB — HEMOGLOBIN A1C
Hgb A1c MFr Bld: 5.5 % (ref <5.7)
Mean Plasma Glucose: 111 mg/dL (ref <117)

## 2014-05-22 MED ORDER — HYDROCODONE-HOMATROPINE 5-1.5 MG PO TABS: 1.0000 | ORAL_TABLET | Freq: Three times a day (TID) | ORAL | Status: DC | PRN

## 2014-05-22 MED ORDER — DILTIAZEM HCL ER COATED BEADS 300 MG PO CP24: 300.0000 mg | ORAL_CAPSULE | Freq: Every day | ORAL | Status: DC

## 2014-05-22 MED ORDER — VALACYCLOVIR HCL 1 G PO TABS: 1000.0000 mg | ORAL_TABLET | Freq: Every day | ORAL | Status: DC

## 2014-05-22 NOTE — Progress Notes (Signed)
Patient ID: Brandi Gutierrez, female   DOB: 1956/06/24, 58 y.o.   MRN: [id number]   Brandi Gutierrez, is a 58 y.o. female  [id number]  [id number]  DOB - 04/11/1956  CC:  Chief Complaint  Patient presents with  . Follow-up       HPI: Brandi Gutierrez is a 58 y.o. female here today to follow up on HTN and Hypokalemia and nausea. On last visit I requested a food diary and patient reports that "sweets" had a strong temporal relationship to the nausea. Thus she has eliminated "sweets" from her diet and this has resolved the nausea. Pt has also found a church which she likes and she feels better adjusted since doing so.   She also reports that she has been taking Valtrex for Herpes but was ashamed to tell me before.    Patient has No headache, No chest pain, No abdominal pain - No Nausea, No new weakness tingling or numbness, No Cough - SOB.  Allergies  Allergen Reactions  . Fish Allergy Hives, Shortness Of Breath and Swelling   No past medical history on file. Current Outpatient Prescriptions on File Prior to Visit  Medication Sig Dispense Refill  . Calcium Carbonate-Vit D-Min (CALCIUM 1200 PO) Take 1,200 mg by mouth once.      . cholecalciferol (VITAMIN D) 1000 UNITS tablet Take 1,000 Units by mouth 2 (two) times daily.      . DULoxetine (CYMBALTA) 30 MG capsule TAKE ONE CAPSULE BY MOUTH EVERY DAY  90 capsule  1  . DULoxetine (CYMBALTA) 30 MG capsule Take 30 mg by mouth daily.      Marland Kitchen estrogens, conjugated, (PREMARIN) 0.625 MG tablet Take 1 tablet (0.625 mg total) by mouth daily. Take daily for 21 days then do not take for 7 days.  30 tablet  2  . Flaxseed, Linseed, (FLAX SEED OIL PO) Take 1,200 capsules by mouth 2 (two) times daily.      Marland Kitchen gemfibrozil (LOPID) 600 MG tablet Take 600 mg by mouth 2 (two) times daily.      . metoCLOPramide (REGLAN) 5 MG tablet Take 1 tablet (5 mg total) by mouth 3 (three) times daily before meals.  90 tablet  0  . OIL OF OREGANO PO Take 45 mg by  mouth once.      Marland Kitchen omeprazole (PRILOSEC) 40 MG capsule Take 40 mg by mouth daily.      . potassium chloride (KLOR-CON) 20 MEQ packet Take 40 mEq by mouth 2 (two) times daily.  120 tablet  3  . triamterene-hydrochlorothiazide (DYAZIDE) 37.5-25 MG per capsule Take 1 capsule by mouth daily.       No current facility-administered medications on file prior to visit.   No family history on file. History   Social History  . Marital Status: Divorced    Spouse Name: N/A    Number of Children: N/A  . Years of Education: N/A   Occupational History  . Not on file.   Social History Main Topics  . Smoking status: Never Smoker   . Smokeless tobacco: Not on file  . Alcohol Use: No  . Drug Use: Not on file  . Sexual Activity: Not on file   Other Topics Concern  . Not on file   Social History Narrative  . No narrative on file    Review of Systems: Constitutional: Negative for fever, chills, diaphoresis, activity change, appetite change and fatigue. HENT: Negative for ear pain, nosebleeds, congestion, facial swelling, rhinorrhea, neck pain, neck  stiffness and ear discharge.  Eyes: Negative for pain, discharge, redness, itching and visual disturbance. Respiratory: Negative for cough, choking, chest tightness, shortness of breath, wheezing and stridor.  Cardiovascular: Negative for chest pain, palpitations and leg swelling. Gastrointestinal: Negative for abdominal distention. Genitourinary: Negative for dysuria, urgency, frequency, hematuria, flank pain, decreased urine volume, difficulty urinating and dyspareunia.  Musculoskeletal: Negative for back pain, joint swelling, arthralgia and gait problem. Neurological: Negative for dizziness, tremors, seizures, syncope, facial asymmetry, speech difficulty, weakness, light-headedness, numbness and headaches.  Hematological: Negative for adenopathy. Does not bruise/bleed easily. Psychiatric/Behavioral: Negative for hallucinations, behavioral  problems, confusion, dysphoric mood, decreased concentration and agitation.    Objective:   Filed Vitals:   05/22/14 0841  BP: 154/69  Pulse: 84  Temp: 98.2 F (36.8 C)  Resp: 14    Physical Exam: Constitutional: Patient appears well-developed and well-nourished. No distress. HENT: Normocephalic, atraumatic, External right and left ear normal. Oropharynx is clear and moist.  Eyes: Conjunctivae and EOM are normal. PERRLA, no scleral icterus. Neck: Normal ROM. Neck supple. No JVD. No tracheal deviation. No thyromegaly. CVS: RRR, S1/S2 +, no murmurs, no gallops, no carotid bruit.  Pulmonary: Effort and breath sounds normal, no stridor, rhonchi, wheezes, rales.  Abdominal: Soft. BS +, no distension, tenderness, rebound or guarding.  Musculoskeletal: Normal range of motion. No edema and no tenderness.  Lymphadenopathy: No lymphadenopathy noted, cervical, inguinal or axillary Neuro: Alert. Normal reflexes, muscle tone coordination. No cranial nerve deficit. Skin: Skin is warm and dry. No rash noted. Not diaphoretic. No erythema. No pallor. Psychiatric: Normal mood and affect. Behavior, judgment, thought content normal.  Lab Results  Component Value Date   WBC 8.1 03/12/2014   HGB 15.8* 03/12/2014   HCT 43.9 03/12/2014   MCV 89.2 03/12/2014   PLT 211 03/12/2014   Lab Results  Component Value Date   CREATININE 1.08 03/12/2014   BUN 13 03/12/2014   NA 134* 03/12/2014   K 3.1* 03/12/2014   CL 92* 03/12/2014   CO2 26 03/12/2014    Lab Results  Component Value Date   HGBA1C 5.3 12/05/2013   Lipid Panel  No results found for this basename: chol, trig, hdl, cholhdl, vldl, ldlcalc       Assessment and plan:   1. Herpes zoster - Pt on suppression dose Valtrex. - Valacyclovir (VALTREX) 1000 MG tablet; Take 1 tablet (1,000 mg total) by mouth daily.  Dispense: 90 tablet; Refill: 3  2. Essential hypertension - Pt reports that her BP is usually in the range of 130/85 at home. I have  asked patient to keep a log of her BP at home and then bring her log and BP cuff in for comparison. I will leave her dosing as is until the BP log is reviewed (per pat request). - diltiazem (CARDIZEM CD) 300 MG 24 hr capsule; Take 1 capsule (300 mg total) by mouth daily.  Dispense: 30 capsule; Refill: 3  3. Hypokalemia - Etiology unclear. This could be secondary to diuretic. Will check potassium today and if still decreased despite supplements will consider changing to Spironolactone or referring to Nephrology to evaluate for renal wasting of potassium - Basic Metabolic Panel  4. Hyperglycemia - Hemoglobin A1C  5. Chronic cough - Hydrocodone-Homatropine 5-1.5 MG TABS; Take 1 tablet by mouth 3 (three) times daily as needed.  Dispense: 90 each; Refill: 0  6. Immunizations - Pt refused Influenza vaccine and states that she does not accept the vaccine.   No Follow-up on file.  The patient was given clear instructions to go to ER or return to medical center if symptoms don't improve, worsen or new problems develop. The patient verbalized understanding. The patient was told to call to get lab results if they haven't heard anything in the next week.     This note has been created with Education officer, environmental. Any transcriptional errors are unintentional.    MATTHEWS,MICHELLE A., MD [facility] [place], [place] [phone]   05/22/2014, 9:15 AM

## 2014-05-23 LAB — BASIC METABOLIC PANEL
BUN: 10 mg/dL (ref 6–23)
CALCIUM: 9.4 mg/dL (ref 8.4–10.5)
CO2: 27 meq/L (ref 19–32)
Chloride: 100 mEq/L (ref 96–112)
Creat: 0.9 mg/dL (ref 0.50–1.10)
GLUCOSE: 108 mg/dL — AB (ref 70–99)
Potassium: 3 mEq/L — ABNORMAL LOW (ref 3.5–5.3)
Sodium: 140 mEq/L (ref 135–145)

## 2014-06-01 ENCOUNTER — Other Ambulatory Visit: Payer: Self-pay | Admitting: Internal Medicine

## 2014-06-16 ENCOUNTER — Telehealth: Payer: Self-pay | Admitting: Internal Medicine

## 2014-06-16 DIAGNOSIS — R053 Chronic cough: Secondary | ICD-10-CM

## 2014-06-16 DIAGNOSIS — R05 Cough: Secondary | ICD-10-CM

## 2014-06-16 MED ORDER — HYDROCODONE-HOMATROPINE 5-1.5 MG PO TABS: 1.0000 | ORAL_TABLET | Freq: Three times a day (TID) | ORAL | Status: DC | PRN

## 2014-06-16 NOTE — Telephone Encounter (Signed)
Refill request for Hydrocodone-homatropine 5-1.5mg . LOV 05/22/2014. Please advise. Thanks!

## 2014-06-16 NOTE — Telephone Encounter (Signed)
Prescription filled for Hycodan #90 tabs. 

## 2014-06-23 ENCOUNTER — Ambulatory Visit (INDEPENDENT_AMBULATORY_CARE_PROVIDER_SITE_OTHER): Payer: Federal, State, Local not specified - PPO | Admitting: Internal Medicine

## 2014-06-23 ENCOUNTER — Other Ambulatory Visit: Payer: Self-pay | Admitting: Internal Medicine

## 2014-06-23 ENCOUNTER — Encounter: Payer: Self-pay | Admitting: Internal Medicine

## 2014-06-23 VITALS — BP 164/72 | HR 84 | Temp 98.5°F | Resp 14 | Ht 62.0 in | Wt 127.0 lb

## 2014-06-23 DIAGNOSIS — R51 Headache: Secondary | ICD-10-CM

## 2014-06-23 DIAGNOSIS — I10 Essential (primary) hypertension: Secondary | ICD-10-CM

## 2014-06-23 DIAGNOSIS — R519 Headache, unspecified: Secondary | ICD-10-CM

## 2014-06-23 DIAGNOSIS — T50905A Adverse effect of unspecified drugs, medicaments and biological substances, initial encounter: Secondary | ICD-10-CM

## 2014-06-23 DIAGNOSIS — Z23 Encounter for immunization: Secondary | ICD-10-CM

## 2014-06-23 DIAGNOSIS — E876 Hypokalemia: Secondary | ICD-10-CM

## 2014-06-23 MED ORDER — LISINOPRIL 10 MG PO TABS: 10.0000 mg | ORAL_TABLET | Freq: Every day | ORAL | Status: DC

## 2014-06-23 NOTE — Progress Notes (Signed)
Patient ID: Brandi Gutierrez, female   DOB: May 17, 1956, 58 y.o.   MRN: [id number]   Brandi Gutierrez, is a 58 y.o. female  [id number]  [id number]  DOB - 20-Jan-1956  CC:  Chief Complaint  Patient presents with  . Follow-up       HPI: Brandi Gutierrez is a 58 y.o. female here today to follow up on hypokalemia, HTN, Headache and Chronic back pain. SHe reports that she has been seeing a [facility] and has been seen 1 x week for the last 4 weeks. She feels that her pain is improved with the chiropractic adjustments.   She also reports that she has had no improvement in her HA which are chronic. She had an MRI done in June 2015 which showed no abnormalities of the brian. She describes HA as located at the top of her head and sometimes in hte retro-orbital area. The pain is throbbing and sharp in nature and sometimes associated with nausea and rarely with emesis. It varies in it's time of day of occuarance and it varies in duration. She is unable to identify any associated palliative or provocative features.   Pt also has chronic back pain and is requesting a suggestion to an Orthopedic Back Specialist for updating her disability state.   Pt reports that she has been compliant with taking her potassium replacement. She was supposed to have her labs done prior to her appointment but forgot.  Patient has  No chest pain, No abdominal pain - No Nausea, No new weakness tingling or numbness, No Cough - SOB.  Allergies  Allergen Reactions  . Fish Allergy Hives, Shortness Of Breath and Swelling   No past medical history on file. Current Outpatient Prescriptions on File Prior to Visit  Medication Sig Dispense Refill  . Calcium Carbonate-Vit D-Min (CALCIUM 1200 PO) Take 1,200 mg by mouth once.      . cholecalciferol (VITAMIN D) 1000 UNITS tablet Take 1,000 Units by mouth 2 (two) times daily.      Marland Kitchen diltiazem (CARDIZEM CD) 300 MG 24 hr capsule Take 1 capsule (300 mg total) by mouth daily.   30 capsule  3  . DULoxetine (CYMBALTA) 30 MG capsule TAKE ONE CAPSULE BY MOUTH EVERY DAY  90 capsule  1  . DULoxetine (CYMBALTA) 30 MG capsule Take 30 mg by mouth daily.      . Flaxseed, Linseed, (FLAX SEED OIL PO) Take 1,200 capsules by mouth 2 (two) times daily.      Marland Kitchen gemfibrozil (LOPID) 600 MG tablet Take 600 mg by mouth 2 (two) times daily.      . Hydrocodone-Homatropine 5-1.5 MG TABS Take 1 tablet by mouth 3 (three) times daily as needed.  90 each  0  . metoCLOPramide (REGLAN) 5 MG tablet Take 1 tablet (5 mg total) by mouth 3 (three) times daily before meals.  90 tablet  0  . OIL OF OREGANO PO Take 45 mg by mouth once.      Marland Kitchen omeprazole (PRILOSEC) 40 MG capsule Take 40 mg by mouth daily.      . potassium chloride (KLOR-CON) 20 MEQ packet Take 40 mEq by mouth 2 (two) times daily.  120 tablet  3  . PREMARIN 0.625 MG tablet TAKE 1 TABLET BY MOUTH DAILY FOR 21 DAYS, THEN DO NOT TAKE FOR 7 DAYS  30 tablet  0  . triamterene-hydrochlorothiazide (DYAZIDE) 37.5-25 MG per capsule Take 1 capsule by mouth daily.      . valACYclovir (VALTREX) 1000 MG tablet  Take 1 tablet (1,000 mg total) by mouth daily.  90 tablet  3   No current facility-administered medications on file prior to visit.   No family history on file. History   Social History  . Marital Status: Divorced    Spouse Name: N/A    Number of Children: N/A  . Years of Education: N/A   Occupational History  . Not on file.   Social History Main Topics  . Smoking status: Never Smoker   . Smokeless tobacco: Not on file  . Alcohol Use: No  . Drug Use: Not on file  . Sexual Activity: Not on file   Other Topics Concern  . Not on file   Social History Narrative  . No narrative on file    Review of Systems: Constitutional: Negative for fever, chills, diaphoresis, activity change, appetite change and fatigue. HENT: Negative for ear pain, nosebleeds, congestion, facial swelling, rhinorrhea, neck pain, neck stiffness and ear  discharge.  Eyes: Negative for pain, discharge, redness, itching and visual disturbance. Respiratory: Negative for cough, choking, chest tightness, shortness of breath, wheezing and stridor.  Cardiovascular: Negative for chest pain, palpitations and leg swelling. Gastrointestinal: Negative for abdominal distention. Genitourinary: Negative for dysuria, urgency, frequency, hematuria, flank pain, decreased urine volume, difficulty urinating and dyspareunia.  Musculoskeletal: Negative for back pain, joint swelling, arthralgia and gait problem. Neurological: Negative for dizziness, tremors, seizures, syncope, facial asymmetry, speech difficulty, weakness, light-headedness, numbness and headaches.  Hematological: Negative for adenopathy. Does not bruise/bleed easily. Psychiatric/Behavioral: Negative for hallucinations, behavioral problems, confusion, dysphoric mood, decreased concentration and agitation.    Objective:    Filed Vitals:   06/23/14 1305  BP: 164/72  Pulse: 84  Temp: 98.5 F (36.9 C)  Resp: 14    Physical Exam: Constitutional: Patient appears well-developed and well-nourished. No distress. HENT: Normocephalic, atraumatic, External right and left ear normal. Oropharynx is clear and moist.  Eyes: Conjunctivae and EOM are normal. PERRLA, no scleral icterus. Neck: Normal ROM. Neck supple. No JVD. No tracheal deviation. No thyromegaly. CVS: RRR, S1/S2 +, no murmurs, no gallops, no carotid bruit.  Pulmonary: Effort and breath sounds normal, no stridor, rhonchi, wheezes, rales.  Abdominal: Soft. BS +, no distension, tenderness, rebound or guarding.  Musculoskeletal: Normal range of motion. No edema and no tenderness.  Lymphadenopathy: No lymphadenopathy noted, cervical, inguinal or axillary Neuro: Alert. Normal reflexes, muscle tone coordination. No cranial nerve deficit. Skin: Skin is warm and dry. No rash noted. Not diaphoretic. No erythema. No pallor. Psychiatric: Normal mood  and affect. Behavior, judgment, thought content normal.  Lab Results  Component Value Date   WBC 8.1 03/12/2014   HGB 15.8* 03/12/2014   HCT 43.9 03/12/2014   MCV 89.2 03/12/2014   PLT 211 03/12/2014   Lab Results  Component Value Date   CREATININE 0.90 05/22/2014   BUN 10 05/22/2014   NA 140 05/22/2014   K 3.0* 05/22/2014   CL 100 05/22/2014   CO2 27 05/22/2014    Lab Results  Component Value Date   HGBA1C 5.5 05/22/2014   Lipid Panel  No results found for this basename: chol, trig, hdl, cholhdl, vldl, ldlcalc       Assessment and plan:   1. Hypokalemia - Will check labs today and if potassium still low despite repalcement will refer to Nephrology. - Basic Metabolic Panel   2. Headache on top of head - Will give a trial of Lisinopril and if no increase coughing or adverse effects, will increase to  20 mg daily. - Ambulatory referral to Neurology - lisinopril (PRINIVIL,ZESTRIL) 10 MG tablet; Take 1 tablet (10 mg total) by mouth daily.  Dispense: 30 tablet; Refill: 1   3. Essential hypertension - Pt still with mild elevations of BP based on readings from home. Will continue Cardizem CD 300 mg and Triamterene/HCTZ and add Lisinopril.  - lisinopril (PRINIVIL,ZESTRIL) 10 MG tablet; Take 1 tablet (10 mg total) by mouth daily.  Dispense: 30 tablet; Refill: 1   4. Immunization Due - Flu Vaccine QUAD 36+ mos PF (Fluarix Quad PF)  5. Chronic Back Pain - Pt is being followed by a [facility] (self-referred) [facility] - Pt requesting suggestion for Orthopedic Back Specialist for her disability uodate. Will suggest Venita Lick, MD  Follow-up in 1 month for HTN, Hypokalemia, Hyperlipidemia, HA  The patient was given clear instructions to go to ER or return to medical center if symptoms don't improve, worsen or new problems develop. The patient verbalized understanding. The patient was told to call to get lab results if they haven't heard anything in the next week.      This note has been created with Education officer, environmental. Any transcriptional errors are unintentional.    Ailie Gage A., MD [facility] [place], [place] [phone]   06/23/2014, 1:40 PM

## 2014-06-24 LAB — BASIC METABOLIC PANEL
BUN: 7 mg/dL (ref 6–23)
CO2: 29 mEq/L (ref 19–32)
Calcium: 9.3 mg/dL (ref 8.4–10.5)
Chloride: 97 mEq/L (ref 96–112)
Creat: 0.79 mg/dL (ref 0.50–1.10)
Glucose, Bld: 87 mg/dL (ref 70–99)
POTASSIUM: 3.5 meq/L (ref 3.5–5.3)
Sodium: 136 mEq/L (ref 135–145)

## 2014-06-24 LAB — MAGNESIUM: MAGNESIUM: 2 mg/dL (ref 1.5–2.5)

## 2014-06-26 ENCOUNTER — Ambulatory Visit: Payer: Federal, State, Local not specified - PPO | Admitting: Internal Medicine

## 2014-07-05 ENCOUNTER — Other Ambulatory Visit: Payer: Self-pay | Admitting: Internal Medicine

## 2014-07-21 ENCOUNTER — Telehealth: Payer: Self-pay | Admitting: Internal Medicine

## 2014-07-21 NOTE — Telephone Encounter (Signed)
Unable to understand which medication should be refilled. Left message for patient to call to clarify.

## 2014-07-24 ENCOUNTER — Ambulatory Visit (INDEPENDENT_AMBULATORY_CARE_PROVIDER_SITE_OTHER): Payer: Federal, State, Local not specified - PPO | Admitting: Internal Medicine

## 2014-07-24 VITALS — BP 155/65 | HR 88 | Temp 98.2°F | Resp 16 | Ht 62.0 in | Wt 129.0 lb

## 2014-07-24 DIAGNOSIS — R053 Chronic cough: Secondary | ICD-10-CM

## 2014-07-24 DIAGNOSIS — R05 Cough: Secondary | ICD-10-CM

## 2014-07-24 MED ORDER — HYDROCODONE-HOMATROPINE 5-1.5 MG PO TABS: 1.0000 | ORAL_TABLET | Freq: Three times a day (TID) | ORAL | Status: DC | PRN

## 2014-07-24 NOTE — Progress Notes (Signed)
Patient ID: Brandi DistanceKathleen Gutierrez, female   DOB: 07/05/1956, 58 y.o.   MRN: [id number] Pt was here today to follow up on BP after beginning new medication- Lisinopril 10 mg. However she has not started medication and thus cannot evaluate effect of change in therapy.  Prescription refilled for Hycodan 5-15 mg #90 tabs

## 2014-08-04 ENCOUNTER — Encounter: Payer: Self-pay | Admitting: Neurology

## 2014-08-04 ENCOUNTER — Telehealth: Payer: Self-pay | Admitting: Internal Medicine

## 2014-08-04 ENCOUNTER — Ambulatory Visit (INDEPENDENT_AMBULATORY_CARE_PROVIDER_SITE_OTHER): Payer: Federal, State, Local not specified - PPO | Admitting: Neurology

## 2014-08-04 ENCOUNTER — Telehealth: Payer: Self-pay | Admitting: *Deleted

## 2014-08-04 VITALS — BP 130/58 | HR 78 | Temp 98.4°F | Resp 16 | Ht 62.0 in | Wt 129.5 lb

## 2014-08-04 DIAGNOSIS — G4441 Drug-induced headache, not elsewhere classified, intractable: Secondary | ICD-10-CM

## 2014-08-04 DIAGNOSIS — G4451 Hemicrania continua: Secondary | ICD-10-CM

## 2014-08-04 DIAGNOSIS — G4440 Drug-induced headache, not elsewhere classified, not intractable: Secondary | ICD-10-CM

## 2014-08-04 MED ORDER — INDOMETHACIN 25 MG PO CAPS: 25.0000 mg | ORAL_CAPSULE | ORAL | Status: DC

## 2014-08-04 NOTE — Patient Instructions (Addendum)
1.  Start indomethacin 25mg  capsules.  Take 1 capsule three times daily for 3 days.  If no relief, then take 2 capsules three times daily for 3 days.  If no relief, then take 3 capsules three times daily for 3 days.  At that point, call with update and we can either continue that dose or increase it further.  Take with food. 2.  Take the Prilosec with it. 3.  Follow up in 3 months. 4. Stop all pain relievers   5. Follow sleep guide

## 2014-08-04 NOTE — Progress Notes (Signed)
NEUROLOGY CONSULTATION NOTE  Brandi Gutierrez MRN: [id number] DOB: 1955-12-21  Referring provider: Dr. Ashley Royalty Primary care provider: Dr. Ashley Royalty  Reason for consult:  Headache  HISTORY OF PRESENT ILLNESS: Brandi Gutierrez is a 58 year old right-handed woman with history of chronic headache, hypertension, chronic back pain, hyperlipidemia, hypokalemia, hyperglycemia and herpes zoster who presents for headache.  Records and MRI personally reviewed.  Onset:  Since childhood Location:  Right-sided, radiating down right side of neck. Quality:  First, ice-pick headache on top of head, which spreads to squeezing non-throbbing pain on right side of head. Intensity:  Initial ice-pick pain is 10/10, exacerbations of right sided pain 9/10 Aura:  no Prodrome:  no Associated symptoms:  Conjunctival injection in right eye, sometimes sees spots in right eye, photophobia.  No nausea, vomiting, phonophobia or osmophobia. Duration:  Ice-pick headache may last only 5 minutes.  If it spreads to exacerbation of right-sided headache, it can last 2-3 days to up to 2 weeks.  She always has a dull constant right sided headache. Frequency:  Ice-pick headache alone occurs 1x/week.  Exacerbations of right sided headache occurs 2-3 times a month (daily headache with 15 to 21 days of exacerbations) Triggers/exacerbating factors:  none Relieving factors:  none Activity:  Can usually force self to function (cannot function for first 3 days of exacerbation)  Past abortive therapy:  none Past preventative therapy:  Verapamil (dose over 200mg  ineffective)  Current abortive therapy:  ASA (ineffective), Tylenol (ineffective), Aleve (ineffective), Advil (ineffective), Excedrin (dulled the headache) Current preventative therapy:  Cymbalta 30mg  (started one month ago),  Other medications:  Vitamin D, diltiazem, gemfibrozil, Lisinopril, omeprazole, potassium chloride, Dyazide, valacyclovir, hydrocodone-homatropine  for cough  Caffeine:  no Alcohol:  no Smoker:  no Diet:  Eats healthy Exercise:  Aerobics daily, walks daily Depression/stress:  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  PAST MEDICAL HISTORY: Past Medical History  Diagnosis Date  . Headache   . Hypertension     PAST SURGICAL HISTORY: Past Surgical History  Procedure Laterality Date  . Cholecystectomy      MEDICATIONS: Current Outpatient Prescriptions on File Prior to Visit  Medication Sig Dispense Refill  . Calcium Carbonate-Vit D-Min (CALCIUM 1200 PO) Take 1,200 mg by mouth once.    . cholecalciferol (VITAMIN D) 1000 UNITS tablet Take 1,000 Units by mouth 2 (two) times daily.    Marland Kitchen diltiazem (CARDIZEM CD) 300 MG 24 hr capsule Take 1 capsule (300 mg total) by mouth daily. 30 capsule 3  . DULoxetine (CYMBALTA) 30 MG capsule TAKE ONE CAPSULE BY MOUTH EVERY DAY 90 capsule 1  . DULoxetine (CYMBALTA) 30 MG capsule Take 30 mg by mouth daily.    . Flaxseed, Linseed, (FLAX SEED OIL PO) Take 1,200 capsules by mouth 2 (two) times daily.    Marland Kitchen gemfibrozil (LOPID) 600 MG tablet Take 600 mg by mouth 2 (two) times daily.    . Hydrocodone-Homatropine 5-1.5 MG TABS Take 1 tablet by mouth 3 (three) times daily as needed. 90 each 0  . lisinopril (PRINIVIL,ZESTRIL) 10 MG tablet Take 1 tablet (10 mg total) by mouth daily. 30 tablet 1  . metoCLOPramide (REGLAN) 5 MG tablet Take 1 tablet (5 mg total) by mouth 3 (three) times daily before meals. 90  tablet 0  . OIL OF OREGANO PO Take 45 mg by mouth once.    Marland Kitchen. omeprazole (PRILOSEC) 40 MG capsule Take 40 mg by mouth daily.    . potassium chloride (KLOR-CON) 20 MEQ packet Take 40 mEq by mouth 2 (two) times daily. 120 tablet 3  . PREMARIN 0.625 MG  tablet TAKE 1 TABLET BY MOUTH DAILY FOR 21 DAYS, THEN DO NOT TAKE FOR 7 DAYS 30 tablet 0  . triamterene-hydrochlorothiazide (DYAZIDE) 37.5-25 MG per capsule Take 1 capsule by mouth daily.    . valACYclovir (VALTREX) 1000 MG tablet Take 1 tablet (1,000 mg total) by mouth daily. 90 tablet 3   No current facility-administered medications on file prior to visit.    ALLERGIES: Allergies  Allergen Reactions  . Fish Allergy Hives, Shortness Of Breath and Swelling    FAMILY HISTORY: Family History  Problem Relation Age of Onset  . Hypertension Sister   . Mental illness Brother   . Hypertension Brother   . Heart attack Father   . Heart attack Brother   . Stroke Mother   . Cancer Mother     lung  . Cancer Father     lung  . Cancer Sister     lung  . Diabetes Sister     SOCIAL HISTORY: History   Social History  . Marital Status: Divorced    Spouse Name: N/A    Number of Children: N/A  . Years of Education: N/A   Occupational History  . Not on file.   Social History Main Topics  . Smoking status: Never Smoker   . Smokeless tobacco: Not on file  . Alcohol Use: No  . Drug Use: No  . Sexual Activity: No   Other Topics Concern  . Not on file   Social History Narrative    REVIEW OF SYSTEMS: Constitutional: No fevers, chills, or sweats, no generalized fatigue, change in appetite Eyes: No visual changes, double vision, eye pain Ear, nose and throat: No hearing loss, ear pain, nasal congestion, sore throat Cardiovascular: No chest pain, palpitations Respiratory:  No shortness of breath at rest or with exertion, wheezes GastrointestinaI: No nausea, vomiting, diarrhea, abdominal pain, fecal incontinence Genitourinary:  No dysuria, urinary retention or frequency Musculoskeletal:  Neck pain Integumentary: No rash, pruritus, skin lesions Neurological: as above Psychiatric: No depression, insomnia, anxiety Endocrine: No palpitations, fatigue, diaphoresis, mood swings, change  in appetite, change in weight, increased thirst Hematologic/Lymphatic:  No anemia, purpura, petechiae. Allergic/Immunologic: no itchy/runny eyes, nasal congestion, recent allergic reactions, rashes  PHYSICAL EXAM: Filed Vitals:   08/04/14 1016  BP: 130/58  Pulse:   Temp:   Resp:    General: No acute distress Eyes:  fundi unremarkable, without vessel changes, exudates, hemorrhages or papilledema. Head:  Normocephalic/atraumatic Neck: supple, right sided paraspinal tenderness, full range of motion Back: No paraspinal tenderness Heart: regular rate and rhythm Lungs: Clear to auscultation bilaterally. Vascular: No carotid bruits. Neurological Exam: Mental status: alert and oriented to person, place, and time, recent and remote memory intact, fund of knowledge intact, attention and concentration intact, speech fluent and not dysarthric, language intact. Cranial nerves: CN I: not tested CN II: pupils equal, round and reactive to light, visual fields intact, fundi unremarkable, without vessel changes, exudates, hemorrhages or papilledema. CN III, IV, VI:  full range of motion, no nystagmus, no ptosis CN V: facial sensation intact CN VII: upper and lower face symmetric CN VIII: hearing intact CN IX, X: gag intact, uvula midline CN  XI: sternocleidomastoid and trapezius muscles intact CN XII: tongue midline Bulk & Tone: normal, no fasciculations. Motor: 5/5 throughout Sensation: temperature and vibration intact Deep Tendon Reflexes: 2+ throughout, toes downgoing Finger to nose testing:  Bilateral intention tremor Heel to shin: no dysmetria Gait: right limp due to sciatica. Romberg negative.  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  Alternatively, may be migraine  PLAN: 1.  Will start indomethacin trial (she is already taking omeprazole) 2.  Stop all other pain relievers  (including hydrocodone-homotropine) 3.  Sleep hygiene 4.  If indomethacin ineffective, consider increasing Cymbalta to 60mg . 5.  Already scheduled to start PT of neck 6.  Follow up in 3 months or as needed.  Thank you for allowing me to take part in the care of this patient.  Shon MilletAdam Marcille Barman, DO  CC:  Marthann SchillerMichelle Matthews, MD

## 2014-08-04 NOTE — Telephone Encounter (Signed)
Left message for patient to stop all other pain medication except for the indocin as this can cause rebound headache sleep sheet was maide to patient today at 11:22pm

## 2014-08-04 NOTE — Telephone Encounter (Signed)
Patient stated she is no longer taking the lisinopril. Patient states it make her cough. She has resumed taking the cardizem.

## 2014-08-06 ENCOUNTER — Other Ambulatory Visit: Payer: Self-pay | Admitting: Internal Medicine

## 2014-08-13 ENCOUNTER — Telehealth: Payer: Self-pay | Admitting: Internal Medicine

## 2014-08-13 DIAGNOSIS — R053 Chronic cough: Secondary | ICD-10-CM

## 2014-08-13 DIAGNOSIS — R05 Cough: Secondary | ICD-10-CM

## 2014-08-13 MED ORDER — HYDROCODONE-HOMATROPINE 5-1.5 MG PO TABS: 1.0000 | ORAL_TABLET | Freq: Three times a day (TID) | ORAL | Status: DC | PRN

## 2014-08-13 NOTE — Telephone Encounter (Signed)
Patient is traveling and will be out of town for a month.

## 2014-08-13 NOTE — Telephone Encounter (Signed)
Prescription re-ordered for Hycodan 5-15 mg # 90 tabs due on 08/24/2014. However pt receiving prescription early as she will be traveling out of town for the next month. [place] reviewed and no inconsistencies found.

## 2014-08-13 NOTE — Telephone Encounter (Signed)
Patient is requesting refill on hydrocodone-homatropine 5/1.5mg . LOV 07/24/2014. Patient states she will be traveling out of town for 1 month. Please advise. Thanks!

## 2014-09-01 ENCOUNTER — Ambulatory Visit (INDEPENDENT_AMBULATORY_CARE_PROVIDER_SITE_OTHER): Payer: Federal, State, Local not specified - PPO | Admitting: Internal Medicine

## 2014-09-01 VITALS — BP 160/75 | HR 91 | Temp 98.3°F | Resp 16 | Ht 62.0 in | Wt 126.0 lb

## 2014-09-01 DIAGNOSIS — I10 Essential (primary) hypertension: Secondary | ICD-10-CM

## 2014-09-01 DIAGNOSIS — G4700 Insomnia, unspecified: Secondary | ICD-10-CM

## 2014-09-01 MED ORDER — DILTIAZEM HCL ER COATED BEADS 360 MG PO CP24: 360.0000 mg | ORAL_CAPSULE | Freq: Every day | ORAL | Status: DC

## 2014-09-01 NOTE — Progress Notes (Signed)
Patient ID: Brandi Gutierrez, female   DOB: [birth year], 58 y.o.   MRN: [id number]   Brandi Gutierrez, is a 58 y.o. female  WGN:[id number]  HQI:[id number]  DOB - 05-13-1956  CC:  Chief Complaint  Patient presents with  . Follow-up    trouble sleeping        HPI: Brandi Gutierrez is a 58 y.o. female here today to follow up on HTN. Pt was prescribed Lisinopril at the last visit and she her cough intensified to the  Point that she was unable to sleep or have social interactions. She brought her BP cuff in today and it is noted that her personal cuff reading is 30 mmg Hg lower than that tin the office.  She also c/o insomnia. She is unable to fall asleep and maintain sleep. She has daytime sleepiness and takes "naps" during the day.  Patient has No headache, No chest pain, No abdominal pain - No Nausea, No new weakness tingling or numbness, No Cough - SOB.  Allergies  Allergen Reactions  . Fish Allergy Hives, Shortness Of Breath and Swelling   Past Medical History  Diagnosis Date  . Headache   . Hypertension    Current Outpatient Prescriptions on File Prior to Visit  Medication Sig Dispense Refill  . aspirin-acetaminophen-caffeine (EXCEDRIN MIGRAINE) 250-250-65 MG per tablet Take by mouth every 6 (six) hours as needed for headache.    . Calcium Carbonate-Vit D-Min (CALCIUM 1200 PO) Take 1,200 mg by mouth once.    . cholecalciferol (VITAMIN D) 1000 UNITS tablet Take 1,000 Units by mouth 2 (two) times daily.    . DULoxetine (CYMBALTA) 30 MG capsule TAKE ONE CAPSULE BY MOUTH EVERY DAY 90 capsule 1  . DULoxetine (CYMBALTA) 30 MG capsule Take 30 mg by mouth daily.    . Flaxseed, Linseed, (FLAX SEED OIL PO) Take 1,200 capsules by mouth 2 (two) times daily.    Marland Kitchen gemfibrozil (LOPID) 600 MG tablet Take 600 mg by mouth 2 (two) times daily.    . Hydrocodone-Homatropine 5-1.5 MG TABS Take 1 tablet by mouth 3 (three) times daily as needed. 90 each 0  . indomethacin (INDOCIN) 25 MG capsule Take  1 capsule (25 mg total) by mouth as directed. 1cap TID x3d, then 2caps TID x3d, 3caps TID 90 capsule 0  . metoCLOPramide (REGLAN) 5 MG tablet Take 1 tablet (5 mg total) by mouth 3 (three) times daily before meals. 90 tablet 0  . OIL OF OREGANO PO Take 45 mg by mouth once.    Marland Kitchen omeprazole (PRILOSEC) 40 MG capsule Take 40 mg by mouth daily.    . potassium chloride (KLOR-CON) 20 MEQ packet Take 40 mEq by mouth 2 (two) times daily. 120 tablet 3  . PREMARIN 0.625 MG tablet TAKE 1 TABLET BY MOUTH EVERY DAY FOR 21 DAYS, THEN DO NOT TAKE FOR 7 DAYS 30 tablet 0  . triamterene-hydrochlorothiazide (DYAZIDE) 37.5-25 MG per capsule Take 1 capsule by mouth daily.    . valACYclovir (VALTREX) 1000 MG tablet Take 1 tablet (1,000 mg total) by mouth daily. 90 tablet 3   No current facility-administered medications on file prior to visit.   Family History  Problem Relation Age of Onset  . Hypertension Sister   . Mental illness Brother   . Hypertension Brother   . Heart attack Father   . Heart attack Brother   . Stroke Mother   . Cancer Mother     lung  . Cancer Father     lung  .  Cancer Sister     lung  . Diabetes Sister    History   Social History  . Marital Status: Divorced    Spouse Name: N/A    Number of Children: N/A  . Years of Education: N/A   Occupational History  . Not on file.   Social History Main Topics  . Smoking status: Never Smoker   . Smokeless tobacco: Not on file  . Alcohol Use: No  . Drug Use: No  . Sexual Activity: No   Other Topics Concern  . Not on file   Social History Narrative    Review of Systems: Constitutional: Negative for fever, chills, diaphoresis, activity change, appetite change and fatigue. HENT: Negative for ear pain, nosebleeds, congestion, facial swelling, rhinorrhea, neck pain, neck stiffness and ear discharge.  Eyes: Negative for pain, discharge, redness, itching and visual disturbance. Respiratory: Negative for cough, choking, chest  tightness, shortness of breath, wheezing and stridor.  Cardiovascular: Negative for chest pain, palpitations and leg swelling. Gastrointestinal: Negative for abdominal distention. Genitourinary: Negative for dysuria, urgency, frequency, hematuria, flank pain, decreased urine volume, difficulty urinating.  Musculoskeletal: Negative for back pain, joint swelling, arthralgia and gait problem. Neurological: Negative for dizziness, tremors, seizures, syncope, facial asymmetry, speech difficulty, weakness, light-headedness, numbness and headaches.  Hematological: Negative for adenopathy. Does not bruise/bleed easily. Psychiatric/Behavioral: Negative for hallucinations, behavioral problems, confusion, dysphoric mood, decreased concentration and agitation.    Objective:   Filed Vitals:   09/01/14 1327  BP: 160/75  Pulse: 91  Temp: 98.3 F (36.8 C)  Resp: 16    Physical Exam: Constitutional: Patient appears well-developed and well-nourished. No distress. HENT: Normocephalic, atraumatic, External right and left ear normal. Oropharynx is clear and moist.  Eyes: Conjunctivae and EOM are normal. PERRLA, no scleral icterus. Neck: Normal ROM. Neck supple. No JVD. No tracheal deviation. No thyromegaly. CVS: RRR, S1/S2 +, no murmurs, no gallops, no carotid bruit.  Pulmonary: Effort and breath sounds normal, no stridor, rhonchi, wheezes, rales.  Abdominal: Soft. BS +, no distension, tenderness, rebound or guarding.  Musculoskeletal: Normal range of motion. No edema and no tenderness.  Lymphadenopathy: No lymphadenopathy noted, cervical, inguinal or axillary Neuro: Alert. Normal reflexes, muscle tone coordination. No cranial nerve deficit. Skin: Skin is warm and dry. No rash noted. Not diaphoretic. No erythema. No pallor. Psychiatric: Normal mood and affect. Behavior, judgment, thought content normal.  Lab Results  Component Value Date   WBC 8.1 03/12/2014   HGB 15.8* 03/12/2014   HCT 43.9  03/12/2014   MCV 89.2 03/12/2014   PLT 211 03/12/2014   Lab Results  Component Value Date   CREATININE 0.79 06/23/2014   BUN 7 06/23/2014   NA 136 06/23/2014   K 3.5 06/23/2014   CL 97 06/23/2014   CO2 29 06/23/2014    Lab Results  Component Value Date   HGBA1C 5.5 05/22/2014   Lipid Panel  No results found for: CHOL, TRIG, HDL, CHOLHDL, VLDL, LDLCALC     Assessment and plan:   1. Essential hypertension - BP continues to be mildly elevated so will increase Cardizem CD t 360 mg from 300 mg and discontinue Lisinopril.   - diltiazem (CARDIZEM CD) 360 MG 24 hr capsule; Take 1 capsule (360 mg total) by mouth daily.  Dispense: 90 capsule; Refill: 3  2. Insomnia - Pt has been having difficulty maintaining sleep and she has had daytime sleepiness. I have discussed sleep hygiene and given her some instructions on improving sleep hygiene. I have  recommended that she obtain a sleep study but she states that she cannot afford the 20% co-pay that is required and will try the conservative measures first.   Return in about 3 months (around 12/01/2014) for Annual Physical, HTN, insomnia.  The patient was given clear instructions to go to ER or return to medical center if symptoms don't improve, worsen or new problems develop. The patient verbalized understanding. The patient was told to call to get lab results if they haven't heard anything in the next week.     This note has been created with Education officer, environmentalDragon speech recognition software and smart phrase technology. Any transcriptional errors are unintentional.    MATTHEWS,MICHELLE A., MD [facility] [place], [place] [phone]   09/01/2014, 2:28 PM

## 2014-09-08 ENCOUNTER — Other Ambulatory Visit: Payer: Self-pay | Admitting: Internal Medicine

## 2014-09-15 ENCOUNTER — Telehealth: Payer: Self-pay | Admitting: Internal Medicine

## 2014-09-15 DIAGNOSIS — R053 Chronic cough: Secondary | ICD-10-CM

## 2014-09-15 DIAGNOSIS — R05 Cough: Secondary | ICD-10-CM

## 2014-09-15 NOTE — Telephone Encounter (Signed)
Refill request for Hydrocodone-Homatropine 5-1.5mg  LOV 09/01/2014. Please advise. Thanks!

## 2014-09-16 MED ORDER — HYDROCODONE-HOMATROPINE 5-1.5 MG PO TABS: 1.0000 | ORAL_TABLET | Freq: Three times a day (TID) | ORAL | Status: DC | PRN

## 2014-09-16 NOTE — Telephone Encounter (Signed)
Prescription re-written for Hycodan 5-1.5 mg #90 tabs.

## 2014-10-07 ENCOUNTER — Other Ambulatory Visit: Payer: Self-pay | Admitting: Internal Medicine

## 2014-10-20 ENCOUNTER — Telehealth: Payer: Self-pay | Admitting: Internal Medicine

## 2014-10-20 DIAGNOSIS — R053 Chronic cough: Secondary | ICD-10-CM

## 2014-10-20 DIAGNOSIS — R05 Cough: Secondary | ICD-10-CM

## 2014-10-20 MED ORDER — HYDROCODONE-HOMATROPINE 5-1.5 MG PO TABS: 1.0000 | ORAL_TABLET | Freq: Three times a day (TID) | ORAL | Status: DC | PRN

## 2014-10-20 NOTE — Telephone Encounter (Signed)
Refill request for Hydrocodone-Homatropine 5-1.5mg LOV 09/01/2014. Please advise. Thanks!  

## 2014-10-20 NOTE — Telephone Encounter (Signed)
Prescription written for Hycodan 5-1.5 mg #90.

## 2014-10-24 ENCOUNTER — Telehealth: Payer: Self-pay | Admitting: Internal Medicine

## 2014-10-24 NOTE — Telephone Encounter (Signed)
Message sent to Dr

## 2014-10-24 NOTE — Telephone Encounter (Signed)
Patient is going on a cruise in early March and would like a patch for motion sickness.

## 2014-10-30 ENCOUNTER — Telehealth: Payer: Self-pay | Admitting: *Deleted

## 2014-10-30 NOTE — Telephone Encounter (Signed)
Patient canceled follow up appointment 

## 2014-10-31 ENCOUNTER — Other Ambulatory Visit: Payer: Self-pay | Admitting: Internal Medicine

## 2014-10-31 DIAGNOSIS — Z87898 Personal history of other specified conditions: Secondary | ICD-10-CM

## 2014-10-31 MED ORDER — SCOPOLAMINE 1 MG/3DAYS TD PT72: 1.0000 | MEDICATED_PATCH | TRANSDERMAL | Status: DC

## 2014-10-31 NOTE — Progress Notes (Signed)
Pt has a history of motion sickness on sea. [place] and has requested prophylaxis for motion sickness. I have prescribed Scopolamine 1.5 mg patch TD q 3 days. She is to apply 3 days before leaving for travel. I have also advised patient that she can take Dramamine OTC as an alternative.

## 2014-10-31 NOTE — Progress Notes (Signed)
Patient notified of rx sent. Thanks!

## 2014-11-01 ENCOUNTER — Other Ambulatory Visit: Payer: Self-pay | Admitting: Internal Medicine

## 2014-11-04 ENCOUNTER — Ambulatory Visit: Payer: Federal, State, Local not specified - PPO | Admitting: Neurology

## 2014-11-24 ENCOUNTER — Telehealth: Payer: Self-pay | Admitting: Internal Medicine

## 2014-11-24 DIAGNOSIS — R05 Cough: Secondary | ICD-10-CM

## 2014-11-24 DIAGNOSIS — R053 Chronic cough: Secondary | ICD-10-CM

## 2014-11-24 MED ORDER — HYDROCODONE-HOMATROPINE 5-1.5 MG PO TABS: 1.0000 | ORAL_TABLET | Freq: Three times a day (TID) | ORAL | Status: DC | PRN

## 2014-11-24 NOTE — Telephone Encounter (Signed)
Prescription written for Hycodan 5-1.5 mg #90.

## 2014-11-24 NOTE — Telephone Encounter (Signed)
Refill request for Hydrocodone-Homatropine5-1.5mg  and Premarin 0.625mg . LOV 09/01/14. Please advise. Thanks!

## 2014-11-26 ENCOUNTER — Other Ambulatory Visit: Payer: Self-pay | Admitting: Internal Medicine

## 2014-12-08 ENCOUNTER — Encounter: Payer: Federal, State, Local not specified - PPO | Admitting: Internal Medicine

## 2014-12-15 ENCOUNTER — Ambulatory Visit: Payer: Federal, State, Local not specified - PPO | Admitting: Internal Medicine

## 2014-12-19 ENCOUNTER — Other Ambulatory Visit: Payer: Self-pay | Admitting: Internal Medicine

## 2014-12-21 ENCOUNTER — Other Ambulatory Visit: Payer: Self-pay | Admitting: Internal Medicine

## 2014-12-21 DIAGNOSIS — R05 Cough: Secondary | ICD-10-CM

## 2014-12-21 DIAGNOSIS — R053 Chronic cough: Secondary | ICD-10-CM

## 2014-12-21 DIAGNOSIS — R232 Flushing: Secondary | ICD-10-CM

## 2014-12-22 MED ORDER — HYDROCODONE-HOMATROPINE 5-1.5 MG PO TABS: 1.0000 | ORAL_TABLET | Freq: Three times a day (TID) | ORAL | Status: DC | PRN

## 2014-12-22 MED ORDER — ESTROGENS CONJUGATED 0.625 MG PO TABS: 0.6250 mg | ORAL_TABLET | Freq: Every day | ORAL | Status: DC

## 2014-12-22 NOTE — Telephone Encounter (Signed)
Prescription written for Hycodan 5-1.5 mg #90 tabs. [place] reviewed and no inconsistencies noted.

## 2014-12-22 NOTE — Telephone Encounter (Signed)
Refill request for Hydrocodone-Homatropine 5-1.5 MG TABS and PREMARIN 0.625 MG tablet  LOV 09/01/2014. Please advise. Thanks!

## 2014-12-31 ENCOUNTER — Other Ambulatory Visit: Payer: Self-pay | Admitting: Internal Medicine

## 2015-01-21 ENCOUNTER — Telehealth: Payer: Self-pay | Admitting: Internal Medicine

## 2015-01-21 DIAGNOSIS — R053 Chronic cough: Secondary | ICD-10-CM

## 2015-01-21 DIAGNOSIS — R05 Cough: Secondary | ICD-10-CM

## 2015-01-22 MED ORDER — HYDROCODONE-HOMATROPINE 5-1.5 MG PO TABS: 1.0000 | ORAL_TABLET | Freq: Three times a day (TID) | ORAL | Status: DC | PRN

## 2015-01-22 NOTE — Telephone Encounter (Signed)
Prescription written for Hycodan 5-1.5 mg #90 tabs. [place] reviewed and no inconsistencies noted.

## 2015-01-22 NOTE — Telephone Encounter (Signed)
Refill request for Hydrocodone-Homatropine 5.15mg . LOV 09/02/2015. Please advise. Thanks!

## 2015-02-01 ENCOUNTER — Other Ambulatory Visit: Payer: Self-pay | Admitting: Internal Medicine

## 2015-02-05 ENCOUNTER — Other Ambulatory Visit: Payer: Self-pay | Admitting: Internal Medicine

## 2015-02-05 ENCOUNTER — Ambulatory Visit (INDEPENDENT_AMBULATORY_CARE_PROVIDER_SITE_OTHER): Payer: Federal, State, Local not specified - PPO | Admitting: Internal Medicine

## 2015-02-05 VITALS — BP 163/68 | HR 85 | Temp 98.2°F | Resp 16 | Ht 62.0 in | Wt 131.0 lb

## 2015-02-05 DIAGNOSIS — R05 Cough: Secondary | ICD-10-CM

## 2015-02-05 DIAGNOSIS — I10 Essential (primary) hypertension: Secondary | ICD-10-CM

## 2015-02-05 DIAGNOSIS — R053 Chronic cough: Secondary | ICD-10-CM

## 2015-02-05 DIAGNOSIS — Z0000 Encounter for general adult medical examination without abnormal findings: Secondary | ICD-10-CM

## 2015-02-05 DIAGNOSIS — E785 Hyperlipidemia, unspecified: Secondary | ICD-10-CM

## 2015-02-05 DIAGNOSIS — R739 Hyperglycemia, unspecified: Secondary | ICD-10-CM

## 2015-02-05 DIAGNOSIS — E559 Vitamin D deficiency, unspecified: Secondary | ICD-10-CM

## 2015-02-05 DIAGNOSIS — E876 Hypokalemia: Secondary | ICD-10-CM

## 2015-02-05 DIAGNOSIS — Z9189 Other specified personal risk factors, not elsewhere classified: Secondary | ICD-10-CM

## 2015-02-05 DIAGNOSIS — Z114 Encounter for screening for human immunodeficiency virus [HIV]: Secondary | ICD-10-CM

## 2015-02-05 MED ORDER — DILTIAZEM HCL ER COATED BEADS 120 MG PO CP24: 120.0000 mg | ORAL_CAPSULE | Freq: Every day | ORAL | Status: DC

## 2015-02-05 NOTE — Progress Notes (Signed)
Patient ID: Brandi Gutierrez, female   DOB: 10/31/1955, 59 y.o.   MRN: [id number]  HPI 59 y.o. female  presents for 3 month follow up with hypertension, hyperlipidemia, prediabetes and vitamin D. Her blood pressure has not been controlled at home, today their BP is BP: (!) 163/68 mmHg She does not workout. She denies chest pain, shortness of breath, dizziness.  She is on cholesterol medication and denies myalgias. Her cholesterol is not at goal. The cholesterol last visit was:  No results found for: CHOL, HDL, LDLCALC, LDLDIRECT, TRIG, CHOLHDL She has been working on diet and exercise for prediabetes, and denies hyperglycemia,  hypoglycemia , increased appetite and nausea. Last A1C in the office was:  Lab Results  Component Value Date   HGBA1C 5.5 05/22/2014   Patient is on Vitamin D supplement.   Lab Results  Component Value Date   VD25OH 6859 12/05/2013       Current Medications:  Current Outpatient Prescriptions on File Prior to Visit  Medication Sig Dispense Refill  . aspirin-acetaminophen-caffeine (EXCEDRIN MIGRAINE) 250-250-65 MG per tablet Take by mouth every 6 (six) hours as needed for headache.    . Calcium Carbonate-Vit D-Min (CALCIUM 1200 PO) Take 1,200 mg by mouth once.    . cholecalciferol (VITAMIN D) 1000 UNITS tablet Take 1,000 Units by mouth 2 (two) times daily.    Marland Kitchen. diltiazem (CARDIZEM CD) 360 MG 24 hr capsule Take 1 capsule (360 mg total) by mouth daily. 90 capsule 3  . DULoxetine (CYMBALTA) 30 MG capsule TAKE ONE CAPSULE BY MOUTH EVERY DAY 90 capsule 1  . estrogens, conjugated, (PREMARIN) 0.625 MG tablet Take 1 tablet (0.625 mg total) by mouth daily. Take daily for 21 days then do not take for 7 days. 30 tablet 6  . Flaxseed, Linseed, (FLAX SEED OIL PO) Take 1,200 capsules by mouth 2 (two) times daily.    Marland Kitchen. gemfibrozil (LOPID) 600 MG tablet Take 600 mg by mouth 2 (two) times daily.    . Hydrocodone-Homatropine 5-1.5 MG TABS Take 1 tablet by mouth 3 (three) times daily as  needed. 90 each 0  . indomethacin (INDOCIN) 25 MG capsule Take 1 capsule (25 mg total) by mouth as directed. 1cap TID x3d, then 2caps TID x3d, 3caps TID 90 capsule 0  . OIL OF OREGANO PO Take 45 mg by mouth once.    Marland Kitchen. omeprazole (PRILOSEC) 40 MG capsule Take 40 mg by mouth daily.    . potassium chloride (KLOR-CON) 20 MEQ packet Take 40 mEq by mouth 2 (two) times daily. 120 tablet 3  . triamterene-hydrochlorothiazide (DYAZIDE) 37.5-25 MG per capsule Take 1 capsule by mouth daily.    . valACYclovir (VALTREX) 1000 MG tablet Take 1 tablet (1,000 mg total) by mouth daily. 90 tablet 3   No current facility-administered medications on file prior to visit.   Medical History:  Past Medical History  Diagnosis Date  . Headache   . Hypertension    Allergies:  Allergies  Allergen Reactions  . Fish Allergy Hives, Shortness Of Breath and Swelling  . Penicillins Itching     Review of Systems:  Review of Systems  Constitutional: Negative.   HENT: Negative.   Eyes: Negative.   Respiratory: Positive for cough (Chronic (>10 years) non-productive cough continues).   Cardiovascular: Negative.   Gastrointestinal: Negative.   Genitourinary: Negative.   Musculoskeletal: Negative.   Skin: Negative.   Neurological: Negative.   Endo/Heme/Allergies: Negative.   Psychiatric/Behavioral: Negative.      Family history- Review  and unchanged Social history- Review and unchanged Physical Exam: BP 163/68 mmHg  Pulse 85  Temp(Src) 98.2 F (36.8 C) (Oral)  Resp 16  Ht 5\' 2"  (1.575 m)  Wt 131 lb (59.421 kg)  BMI 23.95 kg/m2 Wt Readings from Last 3 Encounters:  02/05/15 131 lb (59.421 kg)  09/01/14 126 lb (57.153 kg)  08/04/14 129 lb 8 oz (58.741 kg)   General Appearance: Well nourished, in no apparent distress. Eyes: PERRLA, EOMs, conjunctiva no swelling or erythema Sinuses: No Frontal/maxillary tenderness ENT/Mouth: Ext aud canals clear, TMs without erythema, bulging. No erythema, swelling, or  exudate on post pharynx.  Tonsils not swollen or erythematous. Hearing normal.  Neck: Supple, thyroid normal.  Respiratory: Respiratory effort normal, BS equal bilaterally without rales, rhonchi, wheezing or stridor.  Cardio: RRR with no MRGs. Brisk peripheral pulses without edema.  Abdomen: Soft, + BS.  Non tender, no guarding, rebound, hernias, masses. Lymphatics: Non tender without lymphadenopathy.  Musculoskeletal: Full ROM, functional strength, normal gait.  Skin: Warm, dry without rashes, lesions, ecchymosis.  Neuro: Cranial nerves intact. Normal muscle tone, no cerebellar symptoms. Sensation intact.  Psych: Awake and oriented X 3, normal affect, Insight and Judgment appropriate.   Assessment and Plan:  1. Essential hypertension - Pt continues to have elevated BP will increase Diltiazem to 480 mg daily.  - COMPLETE METABOLIC PANEL WITH GFR -  monitor blood pressure at home. Continue DASH diet.  Reminder to go to the ER if any CP, SOB, nausea, dizziness, severe HA, changes vision/speech, left arm numbness and tingling, and jaw pain. 2. Chronic cough Continue Hycodan for cough suppression.  3. Hypokalemia - Will recheck potassium today. Continue potassium supplementation - Magnesium - COMPLETE METABOLIC PANEL WITH GFR  4. Elevated Cholesterol with Hypertriglyceridemia - Continue diet and exercise. Check cholesterol.   5. Vitamin D Def - check level and continue medications.  6. Pre-diabetes -Continue diet and exercise. Check A1C   Continue diet and meds as discussed. Further disposition pending results of labs.  Tilmon Wisehart A., MD 3:50 PM [facility]

## 2015-02-06 ENCOUNTER — Other Ambulatory Visit: Payer: Self-pay | Admitting: Internal Medicine

## 2015-02-06 ENCOUNTER — Other Ambulatory Visit (INDEPENDENT_AMBULATORY_CARE_PROVIDER_SITE_OTHER): Payer: Federal, State, Local not specified - PPO

## 2015-02-06 DIAGNOSIS — E785 Hyperlipidemia, unspecified: Secondary | ICD-10-CM

## 2015-02-06 DIAGNOSIS — Z0000 Encounter for general adult medical examination without abnormal findings: Secondary | ICD-10-CM

## 2015-02-06 DIAGNOSIS — Z9189 Other specified personal risk factors, not elsewhere classified: Secondary | ICD-10-CM

## 2015-02-06 DIAGNOSIS — R05 Cough: Secondary | ICD-10-CM

## 2015-02-06 DIAGNOSIS — I10 Essential (primary) hypertension: Secondary | ICD-10-CM

## 2015-02-06 DIAGNOSIS — R195 Other fecal abnormalities: Secondary | ICD-10-CM | POA: Diagnosis not present

## 2015-02-06 DIAGNOSIS — E781 Pure hyperglyceridemia: Secondary | ICD-10-CM | POA: Diagnosis not present

## 2015-02-06 DIAGNOSIS — R739 Hyperglycemia, unspecified: Secondary | ICD-10-CM

## 2015-02-06 DIAGNOSIS — E559 Vitamin D deficiency, unspecified: Secondary | ICD-10-CM

## 2015-02-06 DIAGNOSIS — G894 Chronic pain syndrome: Secondary | ICD-10-CM

## 2015-02-06 LAB — CBC WITH DIFFERENTIAL/PLATELET
Basophils Absolute: 0 10*3/uL (ref 0.0–0.1)
Basophils Relative: 0 % (ref 0–1)
EOS ABS: 0.1 10*3/uL (ref 0.0–0.7)
Eosinophils Relative: 2 % (ref 0–5)
HEMATOCRIT: 39.4 % (ref 36.0–46.0)
HEMOGLOBIN: 14.1 g/dL (ref 12.0–15.0)
LYMPHS PCT: 27 % (ref 12–46)
Lymphs Abs: 1.8 10*3/uL (ref 0.7–4.0)
MCH: 31.7 pg (ref 26.0–34.0)
MCHC: 35.8 g/dL (ref 30.0–36.0)
MCV: 88.5 fL (ref 78.0–100.0)
MONO ABS: 0.6 10*3/uL (ref 0.1–1.0)
MPV: 11.6 fL (ref 8.6–12.4)
Monocytes Relative: 9 % (ref 3–12)
NEUTROS ABS: 4 10*3/uL (ref 1.7–7.7)
NEUTROS PCT: 62 % (ref 43–77)
Platelets: 194 10*3/uL (ref 150–400)
RBC: 4.45 MIL/uL (ref 3.87–5.11)
RDW: 13 % (ref 11.5–15.5)
WBC: 6.5 10*3/uL (ref 4.0–10.5)

## 2015-02-06 LAB — COMPLETE METABOLIC PANEL WITH GFR
ALT: 16 U/L (ref 0–35)
AST: 23 U/L (ref 0–37)
Albumin: 4 g/dL (ref 3.5–5.2)
Alkaline Phosphatase: 95 U/L (ref 39–117)
BILIRUBIN TOTAL: 0.4 mg/dL (ref 0.2–1.2)
BUN: 9 mg/dL (ref 6–23)
CALCIUM: 9.1 mg/dL (ref 8.4–10.5)
CHLORIDE: 100 meq/L (ref 96–112)
CO2: 29 mEq/L (ref 19–32)
Creat: 0.89 mg/dL (ref 0.50–1.10)
GFR, EST NON AFRICAN AMERICAN: 72 mL/min
GFR, Est African American: 83 mL/min
Glucose, Bld: 95 mg/dL (ref 70–99)
POTASSIUM: 3.4 meq/L — AB (ref 3.5–5.3)
SODIUM: 137 meq/L (ref 135–145)
Total Protein: 7.3 g/dL (ref 6.0–8.3)

## 2015-02-06 LAB — HEMOGLOBIN A1C
Hgb A1c MFr Bld: 5.4 % (ref <5.7)
Mean Plasma Glucose: 108 mg/dL (ref <117)

## 2015-02-06 LAB — LIPID PANEL
CHOL/HDL RATIO: 4.5 ratio
CHOLESTEROL: 203 mg/dL — AB (ref 0–200)
HDL: 45 mg/dL — ABNORMAL LOW (ref >=46)
TRIGLYCERIDES: 459 mg/dL — AB (ref <150)

## 2015-02-07 LAB — COMPLETE METABOLIC PANEL WITH GFR
ALT: 17 U/L (ref 0–35)
AST: 23 U/L (ref 0–37)
Albumin: 4 g/dL (ref 3.5–5.2)
Alkaline Phosphatase: 92 U/L (ref 39–117)
BUN: 9 mg/dL (ref 6–23)
CALCIUM: 9 mg/dL (ref 8.4–10.5)
CHLORIDE: 101 meq/L (ref 96–112)
CO2: 28 mEq/L (ref 19–32)
Creat: 0.96 mg/dL (ref 0.50–1.10)
GFR, EST AFRICAN AMERICAN: 75 mL/min
GFR, EST NON AFRICAN AMERICAN: 65 mL/min
Glucose, Bld: 96 mg/dL (ref 70–99)
POTASSIUM: 3.4 meq/L — AB (ref 3.5–5.3)
SODIUM: 138 meq/L (ref 135–145)
TOTAL PROTEIN: 7.1 g/dL (ref 6.0–8.3)
Total Bilirubin: 0.4 mg/dL (ref 0.2–1.2)

## 2015-02-07 LAB — VITAMIN D 25 HYDROXY (VIT D DEFICIENCY, FRACTURES): VIT D 25 HYDROXY: 31 ng/mL (ref 30–100)

## 2015-02-07 LAB — MAGNESIUM: Magnesium: 2.1 mg/dL (ref 1.5–2.5)

## 2015-02-07 LAB — HIV ANTIBODY (ROUTINE TESTING W REFLEX): HIV: NONREACTIVE

## 2015-02-09 ENCOUNTER — Encounter: Payer: Self-pay | Admitting: Internal Medicine

## 2015-02-26 ENCOUNTER — Other Ambulatory Visit: Payer: Self-pay

## 2015-02-26 ENCOUNTER — Other Ambulatory Visit: Payer: Self-pay | Admitting: Internal Medicine

## 2015-02-26 DIAGNOSIS — Z1231 Encounter for screening mammogram for malignant neoplasm of breast: Secondary | ICD-10-CM

## 2015-02-27 ENCOUNTER — Telehealth: Payer: Self-pay

## 2015-02-27 DIAGNOSIS — R05 Cough: Secondary | ICD-10-CM

## 2015-02-27 DIAGNOSIS — R053 Chronic cough: Secondary | ICD-10-CM

## 2015-02-27 MED ORDER — HYDROCODONE-HOMATROPINE 5-1.5 MG PO TABS: 1.0000 | ORAL_TABLET | Freq: Three times a day (TID) | ORAL | Status: DC | PRN

## 2015-02-27 NOTE — Telephone Encounter (Signed)
Refill request for Hydrocodone-Homatropine 5-1.5mg  LOV 02/05/2015. Please advise. Thanks!

## 2015-02-27 NOTE — Telephone Encounter (Signed)
Prescription written for Hydrocodone-Homotropine #90 tabs. [facility] reviewed and no inconsistencies noted.

## 2015-03-05 ENCOUNTER — Other Ambulatory Visit: Payer: Self-pay | Admitting: Internal Medicine

## 2015-03-16 ENCOUNTER — Ambulatory Visit
Admission: RE | Admit: 2015-03-16 | Discharge: 2015-03-16 | Disposition: A | Discharge status: Home / Self Care | Payer: Federal, State, Local not specified - PPO | Source: Ambulatory Visit

## 2015-03-16 DIAGNOSIS — Z1231 Encounter for screening mammogram for malignant neoplasm of breast: Secondary | ICD-10-CM

## 2015-03-18 ENCOUNTER — Other Ambulatory Visit: Payer: Self-pay | Admitting: Internal Medicine

## 2015-03-18 DIAGNOSIS — R928 Other abnormal and inconclusive findings on diagnostic imaging of breast: Secondary | ICD-10-CM

## 2015-03-23 ENCOUNTER — Ambulatory Visit
Admission: RE | Admit: 2015-03-23 | Discharge: 2015-03-23 | Disposition: A | Discharge status: Home / Self Care | Payer: Federal, State, Local not specified - PPO | Source: Ambulatory Visit | Attending: Internal Medicine | Admitting: Internal Medicine

## 2015-03-23 DIAGNOSIS — R928 Other abnormal and inconclusive findings on diagnostic imaging of breast: Secondary | ICD-10-CM

## 2015-04-02 ENCOUNTER — Telehealth: Payer: Self-pay | Admitting: Internal Medicine

## 2015-04-02 NOTE — Telephone Encounter (Signed)
Please call and schedule an appointment. Thanks!  

## 2015-04-02 NOTE — Telephone Encounter (Signed)
LOV 02/05/2015

## 2015-04-02 NOTE — Telephone Encounter (Signed)
Will need office visit

## 2015-04-02 NOTE — Telephone Encounter (Signed)
Refill request for Hydrocodone-Homatropine 5-1.5mg . LOV

## 2015-04-07 ENCOUNTER — Encounter: Payer: Self-pay | Admitting: Family Medicine

## 2015-04-07 ENCOUNTER — Ambulatory Visit (INDEPENDENT_AMBULATORY_CARE_PROVIDER_SITE_OTHER): Payer: Federal, State, Local not specified - PPO | Admitting: Family Medicine

## 2015-04-07 VITALS — BP 145/68 | HR 87 | Temp 93.8°F | Resp 16 | Ht 62.0 in | Wt 127.0 lb

## 2015-04-07 DIAGNOSIS — R05 Cough: Secondary | ICD-10-CM

## 2015-04-07 DIAGNOSIS — R053 Chronic cough: Secondary | ICD-10-CM

## 2015-04-07 MED ORDER — HYDROCODONE-HOMATROPINE 5-1.5 MG PO TABS: 1.0000 | ORAL_TABLET | Freq: Three times a day (TID) | ORAL | Status: DC | PRN

## 2015-04-07 NOTE — Patient Instructions (Signed)
Continue current treatment plan Follow-up in one month to see where we go from here.

## 2015-04-07 NOTE — Progress Notes (Signed)
Subjective:     Patient ID: Brandi Gutierrez, female   DOB: 09/05/1956, 59 y.o.   MRN: [id number]  HPI    Patient presents today as a new patient for me to receive a refill on her Hydrocodone Homatropine 5-1.5 which she has been taking for years,  She has a history of a 20 year undiagnosed cough that Dr. Ashley RoyaltyMatthews has been prescribing this medication for on a chronic basis.  She reports having seen pulmonologist, allergist, ENT, and GI and no one has been able to diagnosis the cause of her cough. She describes the cough as a 'throat cough" , not a lung cough. She has a lot of postnasal drainage and has problems with "choking episodes.   Review of Systems  Negative for fever, chills, appetite loss Negative for ear aches or drainage Positive for nasal congestion, post-nasal drainage, choking sensation Positive for cough, Negative for shortness of breath except during coughing jags Negative for chest pain, palpitations.     Objective:   Physical Exam   Alert, oriented, in no acute distress Skin warm and dry TMS clear, eyes clear, nose with minor congestion Neck is supple FROM w/o adenopathy or tenderness Lungs are clear to auscultation. HS regular She coughs very frequently          Chronic cough - I have refilled her hydrocodone with homatropine 5/1/5 for one time but I have explained we may not be able to continue doing so. - I have asked her to return in one month to discuss where we go from here.     Brandi HooverLinda C. Bernhardt, FNP-BC

## 2015-04-30 ENCOUNTER — Other Ambulatory Visit (HOSPITAL_COMMUNITY): Payer: Self-pay | Admitting: Family Medicine

## 2015-04-30 MED ORDER — BENZONATATE 100 MG PO CAPS: 100.0000 mg | ORAL_CAPSULE | Freq: Two times a day (BID) | ORAL | Status: DC

## 2015-05-05 ENCOUNTER — Encounter: Payer: Self-pay | Admitting: Family Medicine

## 2015-05-05 ENCOUNTER — Other Ambulatory Visit: Payer: Self-pay | Admitting: Family Medicine

## 2015-05-05 ENCOUNTER — Ambulatory Visit (INDEPENDENT_AMBULATORY_CARE_PROVIDER_SITE_OTHER): Payer: Federal, State, Local not specified - PPO | Admitting: Family Medicine

## 2015-05-05 VITALS — BP 140/69 | HR 106 | Temp 98.0°F | Resp 16 | Ht 62.0 in | Wt 126.0 lb

## 2015-05-05 DIAGNOSIS — R053 Chronic cough: Secondary | ICD-10-CM

## 2015-05-05 DIAGNOSIS — R05 Cough: Secondary | ICD-10-CM

## 2015-05-05 MED ORDER — METOPROLOL SUCCINATE ER 50 MG PO TB24: 50.0000 mg | ORAL_TABLET | Freq: Every day | ORAL | Status: DC

## 2015-05-05 MED ORDER — HYDROCODONE-HOMATROPINE 5-1.5 MG PO TABS: 1.0000 | ORAL_TABLET | Freq: Three times a day (TID) | ORAL | Status: DC | PRN

## 2015-05-05 NOTE — Progress Notes (Signed)
Patient ID: Brandi Gutierrez, female   DOB: [birth year], 59 y.o.   MRN: [id number]   Brandi Gutierrez, is a 59 y.o. female  WUJ:[id number]  [id number]  DOB - 02-12-1956  CC: No chief complaint on file.      HPI: Brandi Gutierrez is a 59 y.o. female here for follow-up on chronic cough. She reports that Tessalon perles are not helping. She has had this chronic cough for a number of years and has been prescribing Hycodan by Dr. Ashley Royalty.She has a history of hypokalemia on Maxzide for BP and is on potassium replace She has an appointment with ENT in [place]. She will follow-up with [place] after that visit.  Allergies  Allergen Reactions  . Fish Allergy Hives, Shortness Of Breath and Swelling  . Penicillins Itching   Past Medical History  Diagnosis Date  . Headache   . Hypertension    Current Outpatient Prescriptions on File Prior to Visit  Medication Sig Dispense Refill  . aspirin-acetaminophen-caffeine (EXCEDRIN MIGRAINE) 250-250-65 MG per tablet Take by mouth every 6 (six) hours as needed for headache.    . benzonatate (TESSALON PERLES) 100 MG capsule Take 1 capsule (100 mg total) by mouth 2 (two) times daily. 30 capsule 0  . Calcium Carbonate-Vit D-Min (CALCIUM 1200 PO) Take 1,200 mg by mouth once.    . cholecalciferol (VITAMIN D) 1000 UNITS tablet Take 1,000 Units by mouth 2 (two) times daily.    Marland Kitchen diltiazem (CARDIZEM CD) 120 MG 24 hr capsule Take 1 capsule (120 mg total) by mouth daily. 30 capsule 3  . diltiazem (CARDIZEM CD) 360 MG 24 hr capsule Take 1 capsule (360 mg total) by mouth daily. 90 capsule 3  . DULoxetine (CYMBALTA) 30 MG capsule TAKE ONE CAPSULE BY MOUTH EVERY DAY 90 capsule 1  . DULoxetine (CYMBALTA) 30 MG capsule TAKE 1 CAPSULE BY MOUTH EVERY DAY 90 capsule 0  . estrogens, conjugated, (PREMARIN) 0.625 MG tablet Take 1 tablet (0.625 mg total) by mouth daily. Take daily for 21 days then do not take for 7 days. 30 tablet 6  . Flaxseed, Linseed, (FLAX SEED OIL PO) Take  1,200 capsules by mouth 2 (two) times daily.    Marland Kitchen gemfibrozil (LOPID) 600 MG tablet Take 600 mg by mouth 2 (two) times daily.    . Hydrocodone-Homatropine 5-1.5 MG TABS Take 1 tablet by mouth 3 (three) times daily as needed. 90 each 0  . indomethacin (INDOCIN) 25 MG capsule Take 1 capsule (25 mg total) by mouth as directed. 1cap TID x3d, then 2caps TID x3d, 3caps TID 90 capsule 0  . OIL OF OREGANO PO Take 45 mg by mouth once.    Marland Kitchen omeprazole (PRILOSEC) 40 MG capsule Take 40 mg by mouth daily.    . potassium chloride (KLOR-CON) 20 MEQ packet Take 40 mEq by mouth 2 (two) times daily. 120 tablet 3  . PREMARIN 0.625 MG tablet TAKE 1 TABLET BY MOUTH EVERY DAY X 21 DAYS THEN DO NOT TAKE FOR 7 DAYS 30 tablet 0  . triamterene-hydrochlorothiazide (DYAZIDE) 37.5-25 MG per capsule Take 1 capsule by mouth daily.    . valACYclovir (VALTREX) 1000 MG tablet Take 1 tablet (1,000 mg total) by mouth daily. 90 tablet 3   No current facility-administered medications on file prior to visit.   Family History  Problem Relation Age of Onset  . Hypertension Sister   . Mental illness Brother   . Hypertension Brother   . Heart attack Father   . Heart attack Brother   .  Stroke Mother   . Cancer Mother     lung  . Cancer Father     lung  . Cancer Sister     lung  . Diabetes Sister    History   Social History  . Marital Status: Divorced    Spouse Name: N/A  . Number of Children: N/A  . Years of Education: N/A   Occupational History  . Not on file.   Social History Main Topics  . Smoking status: Never Smoker   . Smokeless tobacco: Not on file  . Alcohol Use: No  . Drug Use: No  . Sexual Activity: No   Other Topics Concern  . Not on file   Social History Narrative    Review of Systems: Constitutional: Negative for fever, chills, appetite change, weight loss,  Fatigue. Skin: Negative for rashes or lesions of concern. HENT: Negative for ear pain, ear discharge.nose bleeds. Recent hearing loss,  awaiting hearing aid. Eyes: Negative for pain, discharge, redness, itching and visual disturbance.Wears glasses Neck: Negative for pain, stiffness Respiratory: Positive  For chronic cough, shortness of breath,   Cardiovascular: Negative for chest pain, palpitations. Some swelling of feet and legs on occasion when on feet a lot. Gastrointestinal: Negative for abdominal pain, nausea, vomiting, diarrhea, constipations. Positive for heartburn. Is on omeperozole Genitourinary: Negative for dysuria, urgency hematuria. Positive for frequency, especially at nig.Nht  Musculoskeletal: Positive for back pain, joint pain. Positive for unilateral weakness on the left - sp CVA Neurological: Negative for dizziness, tremors, seizures, syncope,   light-headedness, numbness and headaches.  Hematological: Negative for easy bruising or bleeding Psychiatric/Behavioral: Negative for depression, anxiety, decreased concentration, confusion    Objective:  There were no vitals filed for this visit.  Physical Exam: Constitutional: Patient appears well-developed and well-nourished. No distress. HENT: Normocephalic, atraumatic, External right and left ear normal. Oropharynx is clear and moist.  Eyes: Conjunctivae and EOM are normal. PERRLA, no scleral icterus. Neck: Normal ROM. Neck supple. No lymphadenopathy, No thyromegaly. CVS: RRR, S1/S2 +, no murmurs, no gallops, no rubs Pulmonary: Effort and breath sounds normal, no stridor, rhonchi, wheezes, rales.  Skin: Skin is warm and dry. No rash noted. Not diaphoretic. No erythema. No pallor. Psychiatric: Normal mood and affect. Behavior, judgment, thought content normal.  Lab Results  Component Value Date   WBC 6.5 02/06/2015   HGB 14.1 02/06/2015   HCT 39.4 02/06/2015   MCV 88.5 02/06/2015   PLT 194 02/06/2015   Lab Results  Component Value Date   CREATININE 0.96 02/06/2015   BUN 9 02/06/2015   NA 138 02/06/2015   K 3.4* 02/06/2015   CL 101 02/06/2015    CO2 28 02/06/2015    Lab Results  Component Value Date   HGBA1C 5.4 02/06/2015   Lipid Panel     Component Value Date/Time   CHOL 203* 02/06/2015 0844   TRIG 459* 02/06/2015 0844   HDL 45* 02/06/2015 0844   CHOLHDL 4.5 02/06/2015 0844   VLDL NOT CALC 02/06/2015 0844   LDLCALC NOT CALC 02/06/2015 0844       Assessment and plan:    Chronic cough -I have refilled her Hycodan until she is evaluated by ENT in [place] -Have asked her to have them send [place] a report of the visit and to follow-up here shortly after that visit is completed.  Hypokalemia on diuretic -I have discontinued the Maxzide and am prescribing Toprol XL 50 mg daily -Will have her return in 2 weeks  The patient was given clear instructions to go to ER or return to medical center if symptoms don't improve, worsen or new problems develop. The patient verbalized understanding. The patient was told to call to get lab results if they haven't heard anything in the next week.      Henrietta Hoover, FNP-BC       05/05/2015, 3:24 PM

## 2015-05-05 NOTE — Patient Instructions (Signed)
Follow-up with [place] after your visit with ENT in [place]. Please have them send a report of your visit. Will need to check your potassium and BP.

## 2015-05-12 ENCOUNTER — Encounter: Payer: Self-pay | Admitting: Family Medicine

## 2015-06-11 ENCOUNTER — Ambulatory Visit (INDEPENDENT_AMBULATORY_CARE_PROVIDER_SITE_OTHER): Payer: Federal, State, Local not specified - PPO | Admitting: Family Medicine

## 2015-06-11 VITALS — BP 134/51 | HR 78 | Temp 98.0°F | Ht 62.0 in | Wt 132.0 lb

## 2015-06-11 DIAGNOSIS — Z0000 Encounter for general adult medical examination without abnormal findings: Secondary | ICD-10-CM | POA: Diagnosis not present

## 2015-06-11 DIAGNOSIS — R05 Cough: Secondary | ICD-10-CM | POA: Diagnosis not present

## 2015-06-11 DIAGNOSIS — R053 Chronic cough: Secondary | ICD-10-CM

## 2015-06-11 LAB — COMPLETE METABOLIC PANEL WITH GFR
ALBUMIN: 3.9 g/dL (ref 3.6–5.1)
ALK PHOS: 83 U/L (ref 33–130)
ALT: 35 U/L — ABNORMAL HIGH (ref 6–29)
AST: 40 U/L — ABNORMAL HIGH (ref 10–35)
BILIRUBIN TOTAL: 0.4 mg/dL (ref 0.2–1.2)
BUN: 14 mg/dL (ref 7–25)
CO2: 28 mmol/L (ref 20–31)
CREATININE: 1.07 mg/dL — AB (ref 0.50–1.05)
Calcium: 9.7 mg/dL (ref 8.6–10.4)
Chloride: 99 mmol/L (ref 98–110)
GFR, EST NON AFRICAN AMERICAN: 57 mL/min — AB (ref >=60)
GFR, Est African American: 66 mL/min (ref >=60)
GLUCOSE: 117 mg/dL — AB (ref 65–99)
Potassium: 3.4 mmol/L — ABNORMAL LOW (ref 3.5–5.3)
SODIUM: 138 mmol/L (ref 135–146)
TOTAL PROTEIN: 7 g/dL (ref 6.1–8.1)

## 2015-06-11 LAB — CBC WITH DIFFERENTIAL/PLATELET
Basophils Absolute: 0 10*3/uL (ref 0.0–0.1)
Basophils Relative: 0 % (ref 0–1)
EOS ABS: 0.2 10*3/uL (ref 0.0–0.7)
Eosinophils Relative: 3 % (ref 0–5)
HCT: 39.2 % (ref 36.0–46.0)
Hemoglobin: 14.2 g/dL (ref 12.0–15.0)
Lymphocytes Relative: 35 % (ref 12–46)
Lymphs Abs: 1.9 10*3/uL (ref 0.7–4.0)
MCH: 32.5 pg (ref 26.0–34.0)
MCHC: 36.2 g/dL — AB (ref 30.0–36.0)
MCV: 89.7 fL (ref 78.0–100.0)
MONO ABS: 0.7 10*3/uL (ref 0.1–1.0)
MONOS PCT: 13 % — AB (ref 3–12)
MPV: 11.6 fL (ref 8.6–12.4)
NEUTROS PCT: 49 % (ref 43–77)
Neutro Abs: 2.6 10*3/uL (ref 1.7–7.7)
PLATELETS: 149 10*3/uL — AB (ref 150–400)
RBC: 4.37 MIL/uL (ref 3.87–5.11)
RDW: 13.4 % (ref 11.5–15.5)
WBC: 5.4 10*3/uL (ref 4.0–10.5)

## 2015-06-11 MED ORDER — HYDROCODONE-HOMATROPINE 5-1.5 MG PO TABS: 1.0000 | ORAL_TABLET | Freq: Three times a day (TID) | ORAL | Status: DC | PRN

## 2015-06-11 NOTE — Patient Instructions (Signed)
Check with your drug store about a possible substitute for premarin and i will be glad to switch.  Let me know what the ENT comes up with. i am very interested.

## 2015-06-11 NOTE — Progress Notes (Signed)
Patient ID: Brandi Gutierrez, female   DOB: 09-Feb-1956, 59 y.o.   MRN: [id number]   Brandi Gutierrez, is a 59 y.o. female  [id number]  [id number]  DOB - [birth year]  CC:  Chief Complaint  Patient presents with  . Cough  . Follow-up       HPI: Brandi Gutierrez is a 59 y.o. female here for follow-up of chronic cough. She has had this cough for 20 + years and has been evaluated in the past by ENT and pulmonology with no answer. She tells me today that there is a family history of chronic cough which she had always assumed was related to smoking. She has recently seen an ENT who is investigating the possibility of Cystic Fibrosis. She had a sweat test today and is awaiting results. He also is investigating the possibility of it being related to her sinuses. We are also addressing some health maintenance issues today. She has not been checked for HIV or Hep C.  Allergies  Allergen Reactions  . Fish Allergy Hives, Shortness Of Breath and Swelling  . Penicillins Itching   Past Medical History  Diagnosis Date  . Headache   . Hypertension    Current Outpatient Prescriptions on File Prior to Visit  Medication Sig Dispense Refill  . aspirin-acetaminophen-caffeine (EXCEDRIN MIGRAINE) 250-250-65 MG per tablet Take by mouth every 6 (six) hours as needed for headache.    . Calcium Carbonate-Vit D-Min (CALCIUM 1200 PO) Take 1,200 mg by mouth once.    . cholecalciferol (VITAMIN D) 1000 UNITS tablet Take 1,000 Units by mouth 2 (two) times daily.    Marland Kitchen diltiazem (CARDIZEM CD) 120 MG 24 hr capsule Take 1 capsule (120 mg total) by mouth daily. 30 capsule 3  . diltiazem (CARDIZEM CD) 360 MG 24 hr capsule Take 1 capsule (360 mg total) by mouth daily. 90 capsule 3  . DULoxetine (CYMBALTA) 30 MG capsule TAKE ONE CAPSULE BY MOUTH EVERY DAY 90 capsule 1  . DULoxetine (CYMBALTA) 30 MG capsule TAKE 1 CAPSULE BY MOUTH EVERY DAY 90 capsule 0  . estrogens, conjugated, (PREMARIN) 0.625 MG tablet Take 1  tablet (0.625 mg total) by mouth daily. Take daily for 21 days then do not take for 7 days. 30 tablet 6  . Flaxseed, Linseed, (FLAX SEED OIL PO) Take 1,200 capsules by mouth 2 (two) times daily.    Marland Kitchen gemfibrozil (LOPID) 600 MG tablet Take 600 mg by mouth 2 (two) times daily.    . Hydrocodone-Homatropine 5-1.5 MG TABS Take 1 tablet by mouth 3 (three) times daily as needed. 90 each 0  . indomethacin (INDOCIN) 25 MG capsule Take 1 capsule (25 mg total) by mouth as directed. 1cap TID x3d, then 2caps TID x3d, 3caps TID 90 capsule 0  . metoprolol succinate (TOPROL-XL) 50 MG 24 hr tablet Take 1 tablet (50 mg total) by mouth daily. Take with or immediately following a meal. 30 tablet 1  . OIL OF OREGANO PO Take 45 mg by mouth once.    Marland Kitchen omeprazole (PRILOSEC) 40 MG capsule Take 40 mg by mouth daily.    . potassium chloride (KLOR-CON) 20 MEQ packet Take 40 mEq by mouth 2 (two) times daily. 120 tablet 3  . PREMARIN 0.625 MG tablet TAKE 1 TABLET BY MOUTH EVERY DAY X 21 DAYS THEN DO NOT TAKE FOR 7 DAYS 30 tablet 0  . ranitidine (ZANTAC) 300 MG tablet Take 300 mg by mouth at bedtime.    . valACYclovir (VALTREX) 1000 MG tablet Take  1 tablet (1,000 mg total) by mouth daily. 90 tablet 3  . benzonatate (TESSALON PERLES) 100 MG capsule Take 1 capsule (100 mg total) by mouth 2 (two) times daily. (Patient not taking: Reported on 05/05/2015) 30 capsule 0   No current facility-administered medications on file prior to visit.   Family History  Problem Relation Age of Onset  . Hypertension Sister   . Mental illness Brother   . Hypertension Brother   . Heart attack Father   . Heart attack Brother   . Stroke Mother   . Cancer Mother     lung  . Cancer Father     lung  . Cancer Sister     lung  . Diabetes Sister    Social History   Social History  . Marital Status: Divorced    Spouse Name: N/A  . Number of Children: N/A  . Years of Education: N/A   Occupational History  . Not on file.   Social History  Main Topics  . Smoking status: Never Smoker   . Smokeless tobacco: Not on file  . Alcohol Use: No  . Drug Use: No  . Sexual Activity: No   Other Topics Concern  . Not on file   Social History Narrative    Review of Systems: Constitutional: Negative for fever, chills, appetite change, weight loss, Fatigue. Skin: Negative for rashes or lesions of concern. HENT: Negative for ear pain, ear discharge.nose bleeds. Recent hearing loss, awaiting hearing aid. Eyes: Negative for pain, discharge, redness, itching and visual disturbance.Wears glasses Neck: Negative for pain, stiffness Respiratory: Positive For chronic cough,. Negative shortness of breath,  Cardiovascular: Negative for chest pain, palpitations. Some swelling of feet and legs on occasion when on feet a lot. Gastrointestinal: Negative for abdominal pain, nausea, vomiting, diarrhea, constipations. Positive for heartburn. Is on omeperozole Genitourinary: Negative for dysuria, urgency hematuria. Positive for frequency, especially at nig.Nht  Musculoskeletal: Positive for back pain, joint pain. Positive for unilateral weakness on the left - sp CVA Neurological: Negative for dizziness, tremors, seizures, syncope, light-headedness, numbness and headaches.  Hematological: Negative for easy bruising or bleeding Psychiatric/Behavioral: Negative for depression, anxiety, decreased concentration, confusion    Objective:   Filed Vitals:   06/11/15 1557  BP: 134/51  Pulse: 78  Temp: 98 F (36.7 C)    Physical Exam: Constitutional: Patient appears well-developed and well-nourished. No distress. HENT: Normocephalic, atraumatic, External right and left ear normal. Oropharynx is clear and moist.  Eyes: Conjunctivae and EOM are normal. PERRLA, no scleral icterus. Neck: Normal ROM. Neck supple. No lymphadenopathy, No thyromegaly. CVS: RRR, S1/S2 +, no murmurs, no gallops, no rubs Pulmonary: Effort and breath sounds normal, no  stridor, rhonchi, wheezes, rales.  Abdominal: Soft. Normoactive BS,, no distension, tenderness, rebound or guarding.  Musculoskeletal: Normal range of motion. No edema and no tenderness.  Neuro: Alert.Normal muscle tone coordination. Non-focal Skin: Skin is warm and dry. No rash noted. Not diaphoretic. No erythema. No pallor. Psychiatric: Normal mood and affect. Behavior, judgment, thought content normal.  Lab Results  Component Value Date   WBC 6.5 02/06/2015   HGB 14.1 02/06/2015   HCT 39.4 02/06/2015   MCV 88.5 02/06/2015   PLT 194 02/06/2015   Lab Results  Component Value Date   CREATININE 0.96 02/06/2015   BUN 9 02/06/2015   NA 138 02/06/2015   K 3.4* 02/06/2015   CL 101 02/06/2015   CO2 28 02/06/2015    Lab Results  Component Value Date  HGBA1C 5.4 02/06/2015   Lipid Panel     Component Value Date/Time   CHOL 203* 02/06/2015 0844   TRIG 459* 02/06/2015 0844   HDL 45* 02/06/2015 0844   CHOLHDL 4.5 02/06/2015 0844   VLDL NOT CALC 02/06/2015 0844   LDLCALC NOT CALC 02/06/2015 0844       Assessment and plan:   1. Health care maintenance  - COMPLETE METABOLIC PANEL WITH GFR - CBC with Differential - Hepatitis C Antibody - HIV antibody (with reflex)  2. Chronic cough -follow-up with ENT as planned.  3. Menopausal symptoms -She is to check with her pharmacy to see what she can afford better than Premarin.  Follow-up 3 months.  The patient was given clear instructions to go to ER or return to medical center if symptoms don't improve, worsen or new problems develop. The patient verbalized understanding.      Henrietta Hoover, MSN, FNP-BC   06/11/2015, 4:18 PM

## 2015-06-12 ENCOUNTER — Encounter: Payer: Self-pay | Admitting: Family Medicine

## 2015-06-12 LAB — HEPATITIS C ANTIBODY: HCV AB: NEGATIVE

## 2015-06-12 LAB — HIV ANTIBODY (ROUTINE TESTING W REFLEX): HIV 1&2 Ab, 4th Generation: NONREACTIVE

## 2015-06-15 ENCOUNTER — Telehealth: Payer: Self-pay

## 2015-06-15 NOTE — Telephone Encounter (Signed)
Left message advising patient of low potassium to add bananas and citrus fruits to diet. Asked if patient had any question to return our call and I left our call back information. Thanks!

## 2015-06-15 NOTE — Telephone Encounter (Signed)
-----   Message from Henrietta Hoover, NP sent at 06/15/2015  8:05 AM EDT ----- Potassium a little low. Please add bananas and citrus fruits to diet. Kidney function is a decreased a little more than last check.

## 2015-08-19 ENCOUNTER — Other Ambulatory Visit: Payer: Self-pay | Admitting: Internal Medicine

## 2015-08-25 ENCOUNTER — Other Ambulatory Visit: Payer: Self-pay | Admitting: Family Medicine

## 2015-08-27 HISTORY — PX: OTHER SURGICAL HISTORY: SHX169

## 2015-08-31 ENCOUNTER — Other Ambulatory Visit: Payer: Self-pay | Admitting: Family Medicine

## 2015-08-31 DIAGNOSIS — R053 Chronic cough: Secondary | ICD-10-CM

## 2015-08-31 DIAGNOSIS — R05 Cough: Secondary | ICD-10-CM

## 2015-09-14 ENCOUNTER — Ambulatory Visit (INDEPENDENT_AMBULATORY_CARE_PROVIDER_SITE_OTHER): Payer: Federal, State, Local not specified - PPO | Admitting: Family Medicine

## 2015-09-14 ENCOUNTER — Encounter: Payer: Self-pay | Admitting: Family Medicine

## 2015-09-14 VITALS — BP 163/75 | HR 105 | Temp 97.9°F | Ht 62.0 in | Wt 135.0 lb

## 2015-09-14 DIAGNOSIS — R053 Chronic cough: Secondary | ICD-10-CM

## 2015-09-14 DIAGNOSIS — R519 Headache, unspecified: Secondary | ICD-10-CM

## 2015-09-14 DIAGNOSIS — R05 Cough: Secondary | ICD-10-CM

## 2015-09-14 DIAGNOSIS — E781 Pure hyperglyceridemia: Secondary | ICD-10-CM

## 2015-09-14 DIAGNOSIS — E876 Hypokalemia: Secondary | ICD-10-CM | POA: Diagnosis not present

## 2015-09-14 DIAGNOSIS — R51 Headache: Secondary | ICD-10-CM

## 2015-09-14 DIAGNOSIS — I10 Essential (primary) hypertension: Secondary | ICD-10-CM

## 2015-09-14 LAB — COMPLETE METABOLIC PANEL WITH GFR
ALT: 11 U/L (ref 6–29)
AST: 16 U/L (ref 10–35)
Albumin: 3.9 g/dL (ref 3.6–5.1)
Alkaline Phosphatase: 73 U/L (ref 33–130)
BILIRUBIN TOTAL: 0.4 mg/dL (ref 0.2–1.2)
BUN: 12 mg/dL (ref 7–25)
CHLORIDE: 106 mmol/L (ref 98–110)
CO2: 22 mmol/L (ref 20–31)
CREATININE: 0.87 mg/dL (ref 0.50–1.05)
Calcium: 8.9 mg/dL (ref 8.6–10.4)
GFR, Est African American: 85 mL/min (ref >=60)
GFR, Est Non African American: 74 mL/min (ref >=60)
GLUCOSE: 127 mg/dL — AB (ref 65–99)
Potassium: 3.4 mmol/L — ABNORMAL LOW (ref 3.5–5.3)
Sodium: 139 mmol/L (ref 135–146)
TOTAL PROTEIN: 6.9 g/dL (ref 6.1–8.1)

## 2015-09-14 LAB — LIPID PANEL
Cholesterol: 216 mg/dL — ABNORMAL HIGH (ref 125–200)
HDL: 50 mg/dL (ref >=46)
Total CHOL/HDL Ratio: 4.3 Ratio (ref <=5.0)
Triglycerides: 461 mg/dL — ABNORMAL HIGH (ref <150)

## 2015-09-14 MED ORDER — METOPROLOL SUCCINATE ER 50 MG PO TB24: 50.0000 mg | ORAL_TABLET | Freq: Every day | ORAL | Status: DC

## 2015-09-14 MED ORDER — HYDROCODONE-HOMATROPINE 5-1.5 MG PO TABS
1.0000 | ORAL_TABLET | Freq: Three times a day (TID) | ORAL | Status: DC | PRN
Start: 2015-09-14 — End: 2017-12-14

## 2015-09-14 NOTE — Patient Instructions (Signed)
Continue current medications as prescribed here and elsewhere. Stop Premarin because of the stroke Follow-up with me in 3 months.

## 2015-09-14 NOTE — Progress Notes (Signed)
Patient ID: Brandi Gutierrez, female   DOB: 03/13/1956, 59 y.o.   MRN: [id number]   Brandi Gutierrez, is a 59 y.o. female  [id number]  [id number]  DOB - 12/13/1955  CC:  Chief Complaint  Patient presents with  . follow up    deviated septum repair and dx of primary ciliary dyskinesia per Dr Cecelia ByarsMc [place]irt [place], Dec 3 hospitalized [facility] diagnosed with Stroke, MRI done which showed blockages, she has follow up appointment scheduled with neurologist, needs refill of her BP meds and Premarin and cough suppressant       HPI: Brandi Gutierrez is a 59 y.o. female here for follow-up hypertension and cough. She has recently run out of her metoprolol. Since I last saw her, she had her chronic cough assessed by pulmonology and was diagnosed with Primary Cilarly Diskenesia  She does have follow-up visits planned with pulmonology.  She reports being careful with salt but does not exercise regularly.Since she was seen last here she has suffered a mild CVA and she has a follow-up visit with neurology in January. She was left no deficits.  Allergies  Allergen Reactions  . Fish Allergy Hives, Shortness Of Breath and Swelling  . Penicillins Itching   Past Medical History  Diagnosis Date  . Headache   . Hypertension    Current Outpatient Prescriptions on File Prior to Visit  Medication Sig Dispense Refill  . Calcium Carbonate-Vit D-Min (CALCIUM 1200 PO) Take 1,200 mg by mouth once.    . cholecalciferol (VITAMIN D) 1000 UNITS tablet Take 1,000 Units by mouth 2 (two) times daily.    . Flaxseed, Linseed, (FLAX SEED OIL PO) Take 1,200 capsules by mouth 2 (two) times daily.    . OIL OF OREGANO PO Take 45 mg by mouth once.    Marland Kitchen. omeprazole (PRILOSEC) 40 MG capsule Take 40 mg by mouth daily.    . ranitidine (ZANTAC) 300 MG tablet Take 300 mg by mouth at bedtime.    . valACYclovir (VALTREX) 1000 MG tablet TAKE 1 TABLET BY MOUTH EVERY DAY 90 tablet 0  . aspirin-acetaminophen-caffeine (EXCEDRIN  MIGRAINE) 250-250-65 MG per tablet Take by mouth every 6 (six) hours as needed for headache. Reported on 09/14/2015    . benzonatate (TESSALON PERLES) 100 MG capsule Take 1 capsule (100 mg total) by mouth 2 (two) times daily. (Patient not taking: Reported on 05/05/2015) 30 capsule 0  . DULoxetine (CYMBALTA) 30 MG capsule TAKE ONE CAPSULE BY MOUTH EVERY DAY (Patient not taking: Reported on 09/14/2015) 90 capsule 1  . DULoxetine (CYMBALTA) 30 MG capsule TAKE 1 CAPSULE BY MOUTH EVERY DAY (Patient not taking: Reported on 09/14/2015) 90 capsule 0  . gemfibrozil (LOPID) 600 MG tablet Take 600 mg by mouth 2 (two) times daily. Reported on 09/14/2015    . indomethacin (INDOCIN) 25 MG capsule Take 1 capsule (25 mg total) by mouth as directed. 1cap TID x3d, then 2caps TID x3d, 3caps TID (Patient not taking: Reported on 09/14/2015) 90 capsule 0  . potassium chloride (KLOR-CON) 20 MEQ packet Take 40 mEq by mouth 2 (two) times daily. (Patient not taking: Reported on 09/14/2015) 120 tablet 3   No current facility-administered medications on file prior to visit.   Family History  Problem Relation Age of Onset  . Hypertension Sister   . Mental illness Brother   . Hypertension Brother   . Heart attack Father   . Heart attack Brother   . Stroke Mother   . Cancer Mother  lung  . Cancer Father     lung  . Cancer Sister     lung  . Diabetes Sister    Social History   Social History  . Marital Status: Divorced    Spouse Name: N/A  . Number of Children: N/A  . Years of Education: N/A   Occupational History  . Not on file.   Social History Main Topics  . Smoking status: Never Smoker   . Smokeless tobacco: Not on file  . Alcohol Use: No  . Drug Use: No  . Sexual Activity: No   Other Topics Concern  . Not on file   Social History Narrative    Review of Systems: Constitutional: Negative for fever, chills, appetite change, weight loss,  Fatigue. Skin: Negative for rashes or lesions of  concern. HENT: Negative for ear pain, ear discharge.nose bleeds Eyes: Negative for pain, discharge, redness, itching and visual disturbance. Neck: Negative for pain, stiffness Respiratory: Negative shortness of breath,  Wheezing. Positive for cough. Cardiovascular: Negative for chest pain, palpitations and leg swelling. Gastrointestinal: Negative for abdominal pain, nausea, vomiting, diarrhea, constipations Genitourinary: Negative for dysuria, urgency, frequency, hematuria,  Musculoskeletal: Negative for back pain, joint pain, joint  swelling, and gait problem.Negative for weakness. Neurological: Negative for dizziness, tremors, seizures, syncope,   light-headedness, numbness and headaches.  Hematological: Negative for easy bruising or bleeding Psychiatric/Behavioral: Negative for depression, anxiety, decreased concentration, confusion   Objective:   Filed Vitals:   09/14/15 1132  BP: 163/75  Pulse: 105  Temp: 97.9 F (36.6 C)    Physical Exam: Constitutional: Patient appears well-developed and well-nourished. No distress. HENT: Normocephalic, atraumatic, External right and left ear normal. Oropharynx is clear and moist.  Eyes: Conjunctivae and EOM are normal. PERRLA, no scleral icterus. Neck: Normal ROM. Neck supple. No lymphadenopathy, No thyromegaly. CVS: RRR, S1/S2 +, no murmurs, no gallops, no rubs Pulmonary: Effort and breath sounds normal, no stridor, rhonchi, wheezes, rales.  Abdominal: Soft. Normoactive BS,, no distension, tenderness, rebound or guarding.  Musculoskeletal: Normal range of motion. No edema and no tenderness.  Neuro: Alert.Normal muscle tone coordination. Non-focal Skin: Skin is warm and dry. No rash noted. Not diaphoretic. No erythema. No pallor. Psychiatric: Normal mood and affect. Behavior, judgment, thought content normal.  Lab Results  Component Value Date   WBC 5.4 06/11/2015   HGB 14.2 06/11/2015   HCT 39.2 06/11/2015   MCV 89.7 06/11/2015    PLT 149* 06/11/2015   Lab Results  Component Value Date   CREATININE 1.07* 06/11/2015   BUN 14 06/11/2015   NA 138 06/11/2015   K 3.4* 06/11/2015   CL 99 06/11/2015   CO2 28 06/11/2015    Lab Results  Component Value Date   HGBA1C 5.4 02/06/2015   Lipid Panel     Component Value Date/Time   CHOL 203* 02/06/2015 0844   TRIG 459* 02/06/2015 0844   HDL 45* 02/06/2015 0844   CHOLHDL 4.5 02/06/2015 0844   VLDL NOT CALC 02/06/2015 0844   LDLCALC NOT CALC 02/06/2015 0844       Assessment and plan:   1. Chronic cough - Hydrocodone-Homatropine 5-1.5 MG TABS; Take 1 tablet by mouth 3 (three) times daily as needed.  Dispense: 90 each; Refill: 0  2. Essential hypertension  - metoprolol succinate (TOPROL-XL) 50 MG 24 hr tablet; Take 1 tablet (50 mg total) by mouth daily. Take with or immediately following a meal.  Dispense: 90 tablet; Refill: 1 - COMPLETE METABOLIC PANEL WITH GFR  3. Hypokalemia Continue OTC replacement unless we let you know otherwise.  4. Headache on top of head Continue meds prescribed by neurologist  5. Hypertriglyceridemia - Lipid panel  6 Menopausal symptoms. -Due to the recent CVA, I have explained we can no longer continue to prescribe HRT.   No Follow-up on file.  The patient was given clear instructions to go to ER or return to medical center if symptoms don't improve, worsen or new problems develop. The patient verbalized understanding.    Henrietta Hoover FNP  09/14/2015, 12:16 PM

## 2015-09-24 ENCOUNTER — Telehealth: Payer: Self-pay

## 2015-09-24 ENCOUNTER — Other Ambulatory Visit (HOSPITAL_COMMUNITY): Payer: Self-pay | Admitting: Family Medicine

## 2015-09-24 DIAGNOSIS — E785 Hyperlipidemia, unspecified: Secondary | ICD-10-CM

## 2015-09-24 NOTE — Telephone Encounter (Signed)
-----   Message from Henrietta HooverLinda C Bernhardt, NP sent at 09/24/2015  3:41 PM EST ----- Potassium staying consistent at 3.4. Glucose 127. Kidney function better. Cholesterol and triglycerides high. Need to recheck fasting and do a direct LDL

## 2015-09-24 NOTE — Telephone Encounter (Signed)
Called and advised of labs and patient states she will call back to schedule visit for fasting labs. Thanks!

## 2015-12-14 ENCOUNTER — Ambulatory Visit: Payer: Federal, State, Local not specified - PPO | Admitting: Family Medicine

## 2016-01-15 ENCOUNTER — Other Ambulatory Visit: Payer: Self-pay | Admitting: Family Medicine

## 2016-01-15 ENCOUNTER — Ambulatory Visit (INDEPENDENT_AMBULATORY_CARE_PROVIDER_SITE_OTHER): Payer: Federal, State, Local not specified - PPO | Admitting: Family Medicine

## 2016-01-15 ENCOUNTER — Encounter: Payer: Self-pay | Admitting: Family Medicine

## 2016-01-15 ENCOUNTER — Other Ambulatory Visit: Payer: Self-pay

## 2016-01-15 VITALS — BP 119/67 | HR 80 | Temp 97.6°F | Resp 18 | Wt 138.0 lb

## 2016-01-15 DIAGNOSIS — N951 Menopausal and female climacteric states: Secondary | ICD-10-CM | POA: Diagnosis not present

## 2016-01-15 DIAGNOSIS — E785 Hyperlipidemia, unspecified: Secondary | ICD-10-CM

## 2016-01-15 DIAGNOSIS — I10 Essential (primary) hypertension: Secondary | ICD-10-CM | POA: Diagnosis not present

## 2016-01-15 DIAGNOSIS — E559 Vitamin D deficiency, unspecified: Secondary | ICD-10-CM | POA: Diagnosis not present

## 2016-01-15 DIAGNOSIS — R232 Flushing: Secondary | ICD-10-CM

## 2016-01-15 LAB — CBC WITH DIFFERENTIAL/PLATELET
BASOS PCT: 1 %
Basophils Absolute: 62 cells/uL (ref 0–200)
EOS ABS: 124 {cells}/uL (ref 15–500)
Eosinophils Relative: 2 %
HEMATOCRIT: 42.6 % (ref 35.0–45.0)
HEMOGLOBIN: 14.7 g/dL (ref 11.7–15.5)
LYMPHS ABS: 2046 {cells}/uL (ref 850–3900)
LYMPHS PCT: 33 %
MCH: 32.7 pg (ref 27.0–33.0)
MCHC: 34.5 g/dL (ref 32.0–36.0)
MCV: 94.7 fL (ref 80.0–100.0)
MONO ABS: 558 {cells}/uL (ref 200–950)
MPV: 11.8 fL (ref 7.5–12.5)
Monocytes Relative: 9 %
NEUTROS PCT: 55 %
Neutro Abs: 3410 cells/uL (ref 1500–7800)
Platelets: 205 10*3/uL (ref 140–400)
RBC: 4.5 MIL/uL (ref 3.80–5.10)
RDW: 14.1 % (ref 11.0–15.0)
WBC: 6.2 10*3/uL (ref 3.8–10.8)

## 2016-01-15 LAB — COMPLETE METABOLIC PANEL WITH GFR
ALT: 23 U/L (ref 6–29)
AST: 23 U/L (ref 10–35)
Albumin: 4.2 g/dL (ref 3.6–5.1)
Alkaline Phosphatase: 86 U/L (ref 33–130)
BUN: 19 mg/dL (ref 7–25)
CALCIUM: 9.9 mg/dL (ref 8.6–10.4)
CHLORIDE: 101 mmol/L (ref 98–110)
CO2: 27 mmol/L (ref 20–31)
CREATININE: 1.07 mg/dL — AB (ref 0.50–1.05)
GFR, Est African American: 66 mL/min (ref >=60)
GFR, Est Non African American: 57 mL/min — ABNORMAL LOW (ref >=60)
Glucose, Bld: 102 mg/dL — ABNORMAL HIGH (ref 65–99)
POTASSIUM: 4 mmol/L (ref 3.5–5.3)
SODIUM: 139 mmol/L (ref 135–146)
Total Bilirubin: 0.4 mg/dL (ref 0.2–1.2)
Total Protein: 7 g/dL (ref 6.1–8.1)

## 2016-01-15 LAB — LIPID PANEL
Cholesterol: 203 mg/dL — ABNORMAL HIGH (ref 125–200)
HDL: 42 mg/dL — AB (ref >=46)
LDL CALC: 91 mg/dL (ref <130)
TRIGLYCERIDES: 351 mg/dL — AB (ref <150)
Total CHOL/HDL Ratio: 4.8 Ratio (ref <=5.0)
VLDL: 70 mg/dL — AB (ref <30)

## 2016-01-15 MED ORDER — ESTROGENS CONJUGATED 0.3 MG PO TABS: 0.3000 mg | ORAL_TABLET | Freq: Every day | ORAL | Status: AC

## 2016-01-15 MED ORDER — ATORVASTATIN CALCIUM 40 MG PO TABS: 40.0000 mg | ORAL_TABLET | Freq: Every day | ORAL | Status: AC

## 2016-01-15 MED ORDER — METOPROLOL SUCCINATE ER 50 MG PO TB24: 50.0000 mg | ORAL_TABLET | Freq: Every day | ORAL | Status: DC

## 2016-01-15 MED ORDER — ESTROGENS CONJUGATED 0.3 MG PO TABS
0.3000 mg | ORAL_TABLET | Freq: Every day | ORAL | Status: DC
Start: 2016-01-15 — End: 2016-01-15

## 2016-01-15 NOTE — Patient Instructions (Addendum)
I have prescribed Premarin for hot flashes. Follow-up with specialist as planned for further evaluation of chronic cough. Continue metorprolol and atorvostatin.

## 2016-01-15 NOTE — Progress Notes (Signed)
Patient ID: Brandi Gutierrez, female   DOB: 1956-09-07, 60 y.o.   MRN: [id number]   Brandi Gutierrez, is a 60 y.o. female  [id number]  [id number]  DOB - Mar 11, 1956  CC:  Chief Complaint  Patient presents with  . Follow-up    cough, hot flashes       HPI: Brandi Gutierrez is a 60 y.o. female here for follow-up chronic conditions. She has hypertension and is on metoprolol xl 50 daily. She is on atorvostatin for hyperlipidemia, omeperozole for GERD. She request to restart premarin for menopausal symptoms. She is on gabapentin 300 daily for burning of feet. She has a chronic cough which is being assessed by pulmonlogy and GI. She has a history of a vitamin D deficiency and hypokalemia. She experiences chronic back pain due to disc disease.   Health maintenance: Up to date.  Allergies  Allergen Reactions  . Fish Allergy Hives, Shortness Of Breath and Swelling  . Penicillins Itching   Past Medical History  Diagnosis Date  . Headache   . Hypertension    Current Outpatient Prescriptions on File Prior to Visit  Medication Sig Dispense Refill  . aspirin 81 MG tablet Take 81 mg by mouth daily.    . Hydrocodone-Homatropine 5-1.5 MG TABS Take 1 tablet by mouth 3 (three) times daily as needed. 90 each 0  . omeprazole (PRILOSEC) 40 MG capsule Take 40 mg by mouth daily.    Marland Kitchen aspirin-acetaminophen-caffeine (EXCEDRIN MIGRAINE) 250-250-65 MG per tablet Take by mouth every 6 (six) hours as needed for headache. Reported on 01/15/2016    . benzonatate (TESSALON PERLES) 100 MG capsule Take 1 capsule (100 mg total) by mouth 2 (two) times daily. (Patient not taking: Reported on 05/05/2015) 30 capsule 0  . Calcium Carbonate-Vit D-Min (CALCIUM 1200 PO) Take 1,200 mg by mouth once. Reported on 01/15/2016    . cholecalciferol (VITAMIN D) 1000 UNITS tablet Take 1,000 Units by mouth 2 (two) times daily. Reported on 01/15/2016    . divalproex (DEPAKOTE) 500 MG DR tablet Take 500 mg by mouth 2 (two) times  daily. Reported on 01/15/2016    . DULoxetine (CYMBALTA) 30 MG capsule TAKE ONE CAPSULE BY MOUTH EVERY DAY (Patient not taking: Reported on 09/14/2015) 90 capsule 1  . DULoxetine (CYMBALTA) 30 MG capsule TAKE 1 CAPSULE BY MOUTH EVERY DAY (Patient not taking: Reported on 09/14/2015) 90 capsule 0  . Flaxseed, Linseed, (FLAX SEED OIL PO) Take 1,200 capsules by mouth 2 (two) times daily. Reported on 01/15/2016    . indomethacin (INDOCIN) 25 MG capsule Take 1 capsule (25 mg total) by mouth as directed. 1cap TID x3d, then 2caps TID x3d, 3caps TID (Patient not taking: Reported on 09/14/2015) 90 capsule 0  . methylPREDNISolone (MEDROL DOSEPAK) 4 MG TBPK tablet Take by mouth. Reported on 01/15/2016    . OIL OF OREGANO PO Take 45 mg by mouth once. Reported on 01/15/2016    . potassium chloride (KLOR-CON) 20 MEQ packet Take 40 mEq by mouth 2 (two) times daily. (Patient not taking: Reported on 09/14/2015) 120 tablet 3  . ranitidine (ZANTAC) 300 MG tablet Take 300 mg by mouth at bedtime. Reported on 01/15/2016    . topiramate (TOPAMAX) 25 MG capsule Take 25 mg by mouth 2 (two) times daily. Reported on 01/15/2016    . valACYclovir (VALTREX) 1000 MG tablet TAKE 1 TABLET BY MOUTH EVERY DAY (Patient not taking: Reported on 01/15/2016) 90 tablet 0   No current facility-administered medications on file prior to visit.  Family History  Problem Relation Age of Onset  . Hypertension Sister   . Mental illness Brother   . Hypertension Brother   . Heart attack Father   . Heart attack Brother   . Stroke Mother   . Cancer Mother     lung  . Cancer Father     lung  . Cancer Sister     lung  . Diabetes Sister    Social History   Social History  . Marital Status: Divorced    Spouse Name: N/A  . Number of Children: N/A  . Years of Education: N/A   Occupational History  . Not on file.   Social History Main Topics  . Smoking status: Never Smoker   . Smokeless tobacco: Not on file  . Alcohol Use: No  . Drug  Use: No  . Sexual Activity: No   Other Topics Concern  . Not on file   Social History Narrative    Review of Systems: Constitutional: Negative for fever, chills, appetite change, weight loss. Positive for  Fatigue, Hot flashes.  Skin: Negative for rashes or lesions of concern. HENT: Negative for ear pain, ear discharge.nose bleeds Eyes: Negative for pain, discharge, redness, itching and visual disturbance. Neck: Negative for pain, stiffness Respiratory: Positive for chronic cough, shortness of breath,   Cardiovascular: Negative for chest pain, palpitations and leg swelling. Gastrointestinal: Negative for abdominal pain, nausea, vomiting, diarrhea, constipations Genitourinary: Negative for dysuria, urgency, frequency, hematuria,  Musculoskeletal: Positive  For chronic back pain, elbow pain, neck pain Neurological: Negative for dizziness, tremors, seizures, syncope,   light-headedness, numbness and headaches.  Hematological: Negative for easy bruising or bleeding Psychiatric/Behavioral: Negative for depression, anxiety, decreased concentration, confusion   Objective:   Filed Vitals:   01/15/16 0840  BP: 119/67  Pulse: 80  Temp: 97.6 F (36.4 C)  Resp: 18    Physical Exam: Constitutional: Patient appears well-developed and well-nourished. No distress. HENT: Normocephalic, atraumatic, External right and left ear normal. Oropharynx is clear and moist.  Eyes: Conjunctivae and EOM are normal. PERRLA, no scleral icterus. Neck: Normal ROM. Neck supple. No lymphadenopathy, No thyromegaly. CVS: RRR, S1/S2 +, no murmurs, no gallops, no rubs Pulmonary: Effort and breath sounds normal, no stridor, rhonchi, wheezes, rales.  Abdominal: Soft. Normoactive BS,, no distension, tenderness, rebound or guarding.  Musculoskeletal: Normal range of motion. No edema and no tenderness.  Neuro: Alert.Normal muscle tone coordination. Non-focal Skin: Skin is warm and dry. No rash noted. Not  diaphoretic. No erythema. No pallor. Psychiatric: Normal mood and affect. Behavior, judgment, thought content normal.  Lab Results  Component Value Date   WBC 5.4 06/11/2015   HGB 14.2 06/11/2015   HCT 39.2 06/11/2015   MCV 89.7 06/11/2015   PLT 149* 06/11/2015   Lab Results  Component Value Date   CREATININE 0.87 09/14/2015   BUN 12 09/14/2015   NA 139 09/14/2015   K 3.4* 09/14/2015   CL 106 09/14/2015   CO2 22 09/14/2015    Lab Results  Component Value Date   HGBA1C 5.4 02/06/2015   Lipid Panel     Component Value Date/Time   CHOL 216* 09/14/2015 1214   TRIG 461* 09/14/2015 1214   HDL 50 09/14/2015 1214   CHOLHDL 4.3 09/14/2015 1214   VLDL NOT CALC 09/14/2015 1214   LDLCALC NOT CALC 09/14/2015 1214       Assessment and plan:   1. Essential hypertension  - COMPLETE METABOLIC PANEL WITH GFR - CBC with Differential  -  metoprolol succinate (TOPROL-XL) 50 MG 24 hr tablet; Take 1 tablet (50 mg total) by mouth daily. Take with or immediately following a meal.  Dispense: 90 tablet; Refill: 1  2. Vitamin D deficiency  - Vitamin D 1,25 dihydroxy  3. Hot flashes -Premarin 0.3, #30 with 3 refills, one po daily.   4. Hyperlipemia - Lipid panel - Refill atorvostatin 40, daily  Return in about 6 months (around 07/16/2016).  The patient was given clear instructions to go to ER or return to medical center if symptoms don't improve, worsen or new problems develop. The patient verbalized understanding.    Henrietta HooverLinda C Clifford Benninger FNP  01/15/2016, 9:36 AM

## 2016-01-18 LAB — VITAMIN D 1,25 DIHYDROXY
VITAMIN D 1, 25 (OH) TOTAL: 46 pg/mL (ref 18–72)
Vitamin D2 1, 25 (OH)2: <8 pg/mL
Vitamin D3 1, 25 (OH)2: 46 pg/mL

## 2016-07-22 ENCOUNTER — Ambulatory Visit: Payer: Federal, State, Local not specified - PPO | Admitting: Family Medicine

## 2017-01-16 ENCOUNTER — Other Ambulatory Visit: Payer: Self-pay | Admitting: Family Medicine

## 2017-02-24 ENCOUNTER — Other Ambulatory Visit (HOSPITAL_BASED_OUTPATIENT_CLINIC_OR_DEPARTMENT_OTHER): Payer: Self-pay | Admitting: Physician Assistant

## 2017-02-24 DIAGNOSIS — Z1231 Encounter for screening mammogram for malignant neoplasm of breast: Secondary | ICD-10-CM

## 2017-03-02 ENCOUNTER — Ambulatory Visit (HOSPITAL_BASED_OUTPATIENT_CLINIC_OR_DEPARTMENT_OTHER): Payer: Federal, State, Local not specified - PPO

## 2017-03-09 ENCOUNTER — Ambulatory Visit (HOSPITAL_BASED_OUTPATIENT_CLINIC_OR_DEPARTMENT_OTHER)
Admission: RE | Admit: 2017-03-09 | Discharge: 2017-03-09 | Disposition: A | Discharge status: Home / Self Care | Payer: Federal, State, Local not specified - PPO | Source: Ambulatory Visit | Attending: Physician Assistant | Admitting: Physician Assistant

## 2017-03-09 DIAGNOSIS — R928 Other abnormal and inconclusive findings on diagnostic imaging of breast: Secondary | ICD-10-CM | POA: Insufficient documentation

## 2017-03-09 DIAGNOSIS — Z1231 Encounter for screening mammogram for malignant neoplasm of breast: Secondary | ICD-10-CM | POA: Diagnosis present

## 2017-03-10 ENCOUNTER — Other Ambulatory Visit: Payer: Self-pay | Admitting: Physician Assistant

## 2017-03-10 DIAGNOSIS — R928 Other abnormal and inconclusive findings on diagnostic imaging of breast: Secondary | ICD-10-CM

## 2017-03-15 ENCOUNTER — Other Ambulatory Visit: Payer: Federal, State, Local not specified - PPO

## 2017-03-16 ENCOUNTER — Ambulatory Visit
Admission: RE | Admit: 2017-03-16 | Discharge: 2017-03-16 | Disposition: A | Discharge status: Home / Self Care | Payer: Federal, State, Local not specified - PPO | Source: Ambulatory Visit | Attending: Physician Assistant | Admitting: Physician Assistant

## 2017-03-16 DIAGNOSIS — R928 Other abnormal and inconclusive findings on diagnostic imaging of breast: Secondary | ICD-10-CM

## 2017-05-15 ENCOUNTER — Other Ambulatory Visit (HOSPITAL_BASED_OUTPATIENT_CLINIC_OR_DEPARTMENT_OTHER): Payer: Self-pay | Admitting: Physician Assistant

## 2017-05-15 DIAGNOSIS — R748 Abnormal levels of other serum enzymes: Secondary | ICD-10-CM

## 2017-05-15 DIAGNOSIS — R1013 Epigastric pain: Secondary | ICD-10-CM

## 2017-06-02 ENCOUNTER — Encounter (HOSPITAL_BASED_OUTPATIENT_CLINIC_OR_DEPARTMENT_OTHER): Payer: Self-pay

## 2017-06-02 ENCOUNTER — Ambulatory Visit (HOSPITAL_BASED_OUTPATIENT_CLINIC_OR_DEPARTMENT_OTHER)
Admission: RE | Admit: 2017-06-02 | Discharge: 2017-06-02 | Disposition: A | Discharge status: Home / Self Care | Payer: Federal, State, Local not specified - PPO | Source: Ambulatory Visit | Attending: Physician Assistant | Admitting: Physician Assistant

## 2017-06-02 DIAGNOSIS — R748 Abnormal levels of other serum enzymes: Secondary | ICD-10-CM | POA: Diagnosis not present

## 2017-06-02 DIAGNOSIS — R1013 Epigastric pain: Secondary | ICD-10-CM | POA: Insufficient documentation

## 2017-06-02 MED ORDER — IOPAMIDOL (ISOVUE-300) INJECTION 61%
100.0000 mL | Freq: Once | INTRAVENOUS | Status: AC | PRN
  Administered 2017-06-02: 100 mL via INTRAVENOUS

## 2017-07-24 ENCOUNTER — Other Ambulatory Visit: Payer: Self-pay | Admitting: Family Medicine

## 2017-12-14 ENCOUNTER — Ambulatory Visit: Payer: Federal, State, Local not specified - PPO | Admitting: Neurology

## 2017-12-14 ENCOUNTER — Encounter: Payer: Self-pay | Admitting: Neurology

## 2017-12-14 VITALS — BP 183/88 | HR 83 | Ht 62.0 in | Wt 131.4 lb

## 2017-12-14 DIAGNOSIS — R202 Paresthesia of skin: Secondary | ICD-10-CM

## 2017-12-14 DIAGNOSIS — M79609 Pain in unspecified limb: Secondary | ICD-10-CM | POA: Diagnosis not present

## 2017-12-14 MED ORDER — PREGABALIN 50 MG PO CAPS: 150.0000 mg | ORAL_CAPSULE | Freq: Two times a day (BID) | ORAL | 5 refills | Status: DC

## 2017-12-14 MED ORDER — ALPRAZOLAM 1 MG PO TABS: 1.0000 mg | ORAL_TABLET | Freq: Every evening | ORAL | 0 refills | Status: AC | PRN

## 2017-12-14 NOTE — Progress Notes (Signed)
PATIENT: Brandi Gutierrez DOB: 01/30/1956  Chief Complaint  Patient presents with  . NP Referral Ansel BongMelanie AYdt, PA  . Progressive night time buring bilateral feet with paresthesi    numbness toes and burning bilateral foot pain.  She currently has a migraine right now.Marland Kitchen.       HISTORICAL  Brandi DistanceKathleen Gutierrez is a 62 year old female, seen in refer by primary care PA Aydt, Melanie for evaluation of bilateral feet paresthesia, initial evaluation was on December 14, 2017.  I have reviewed and summarized the referring note, she has history of hypertension, hyperlipidemia, hypokalemia, migraine, presented with bilateral feet paresthesia  Since August 2018, she noticed symmetric bilateral feet paresthesia, involving the ball of her feet, plantar surface, getting worse after bearing weight, difficulty sleeping at nighttime, burning pain, she tends to get up barefoot, walking on cold floor, she does has the urge to move to alleviate bilateral feet and neck discomfort, She was recently started on Lyrica 50 mg every night, which has helped her symptoms, she denies bilateral upper extremity paresthesia,  In addition, she complains of chronic low back pain, radiating pain to right lower extremity, difficult to put the right heel on the floor, she denies bowel and bladder incontinence.  Chronic migraine headaches for many years, couple times each months, responding well to over-the-counter medications,  MRI brain in 2015 was normal,   Laboratory evaluation included 2017 showed normal vitamin D, CBC, CMP showed mild elevated creatinine 1.07, glucose 102, lipid profile showed elevated total cholesterol 203, LDL was 91  REVIEW OF SYSTEMS: Full 14 system review of systems performed and notable only for headache, numbness, joint pain, cough  ALLERGIES: Allergies  Allergen Reactions  . Fish Allergy Hives, Shortness Of Breath and Swelling  . Penicillins Itching    HOME MEDICATIONS: Current Outpatient  Medications  Medication Sig Dispense Refill  . amLODipine (NORVASC) 10 MG tablet Take 10 mg by mouth daily.    Marland Kitchen. aspirin-acetaminophen-caffeine (EXCEDRIN MIGRAINE) 250-250-65 MG per tablet Take by mouth every 6 (six) hours as needed for headache. Reported on 01/15/2016    . atorvastatin (LIPITOR) 40 MG tablet Take 1 tablet (40 mg total) by mouth daily. Reported on 01/15/2016 90 tablet 1  . Calcium Carbonate-Vit D-Min (CALCIUM 1200 PO) Take 1,200 mg by mouth once. Reported on 01/15/2016    . cholecalciferol (VITAMIN D) 1000 UNITS tablet Take 1,000 Units by mouth 2 (two) times daily. Reported on 01/15/2016    . estrogens, conjugated, (PREMARIN) 0.3 MG tablet Take 1 tablet (0.3 mg total) by mouth daily. 30 tablet 3  . Flaxseed, Linseed, (FLAX SEED OIL PO) Take 1,200 capsules by mouth 2 (two) times daily. Reported on 01/15/2016    . OIL OF OREGANO PO Take 45 mg by mouth once. Reported on 01/15/2016    . pantoprazole (PROTONIX) 20 MG tablet Take 20 mg by mouth 2 (two) times daily.    . pregabalin (LYRICA) 50 MG capsule Take 50 mg by mouth 2 (two) times daily.    Marland Kitchen. topiramate (TOPAMAX) 25 MG capsule Take 25 mg by mouth 2 (two) times daily. Reported on 01/15/2016    . traZODone (DESYREL) 50 MG tablet Take 50 mg by mouth at bedtime.    . valACYclovir (VALTREX) 1000 MG tablet TAKE 1 TABLET BY MOUTH EVERY DAY 90 tablet 0  . divalproex (DEPAKOTE) 500 MG DR tablet Take 500 mg by mouth 2 (two) times daily. Reported on 01/15/2016    . DULoxetine (CYMBALTA) 30 MG capsule TAKE  ONE CAPSULE BY MOUTH EVERY DAY (Patient not taking: Reported on 09/14/2015) 90 capsule 1   No current facility-administered medications for this visit.     PAST MEDICAL HISTORY: Past Medical History:  Diagnosis Date  . Headache   . High cholesterol   . Hypertension   . Migraines     PAST SURGICAL HISTORY: Past Surgical History:  Procedure Laterality Date  . CHOLECYSTECTOMY    . gall stones  2014  . hysterectomy  1989  . stroke   08/2015   mild    FAMILY HISTORY: Family History  Problem Relation Age of Onset  . Hypertension Sister   . Mental illness Brother   . Hypertension Brother   . Heart attack Father   . Cancer Father        lung  . Heart attack Brother   . Stroke Mother   . Cancer Mother        lung  . Cancer Sister        lung  . Diabetes Sister     SOCIAL HISTORY:  Social History   Socioeconomic History  . Marital status: Divorced    Spouse name: Not on file  . Number of children: Not on file  . Years of education: Not on file  . Highest education level: Not on file  Occupational History  . Not on file  Social Needs  . Financial resource strain: Not on file  . Food insecurity:    Worry: Not on file    Inability: Not on file  . Transportation needs:    Medical: Not on file    Non-medical: Not on file  Tobacco Use  . Smoking status: Never Smoker  Substance and Sexual Activity  . Alcohol use: No    Alcohol/week: 0.0 oz  . Drug use: No  . Sexual activity: Never  Lifestyle  . Physical activity:    Days per week: Not on file    Minutes per session: Not on file  . Stress: Not on file  Relationships  . Social connections:    Talks on phone: Not on file    Gets together: Not on file    Attends religious service: Not on file    Active member of club or organization: Not on file    Attends meetings of clubs or organizations: Not on file    Relationship status: Not on file  . Intimate partner violence:    Fear of current or ex partner: Not on file    Emotionally abused: Not on file    Physically abused: Not on file    Forced sexual activity: Not on file  Other Topics Concern  . Not on file  Social History Narrative   Lives home with sister.  Works at [employer].  Education:  HS grad. Children 3.  Caffeine 80z weekly.     PHYSICAL EXAM   Vitals:   12/14/17 1347  BP: (!) 183/88  Pulse: 83  Weight: 131 lb 6.4 oz (59.6 kg)  Height: 5\' 2"  (1.575 m)    Not  recorded      Body mass index is 24.03 kg/m.  PHYSICAL EXAMNIATION:  Gen: NAD, conversant, well nourised, obese, well groomed                     Cardiovascular: Regular rate rhythm, no peripheral edema, warm, nontender. Eyes: Conjunctivae clear without exudates or hemorrhage Neck: Supple, no carotid bruits. Pulmonary: Clear to auscultation bilaterally  NEUROLOGICAL EXAM:  MENTAL STATUS: Speech:    Speech is normal; fluent and spontaneous with normal comprehension.  Cognition:     Orientation to time, place and person     Normal recent and remote memory     Normal Attention span and concentration     Normal Language, naming, repeating,spontaneous speech     Fund of knowledge   CRANIAL NERVES: CN II: Visual fields are full to confrontation. Fundoscopic exam is normal with sharp discs and no vascular changes. Pupils are round equal and briskly reactive to light. CN III, IV, VI: extraocular movement are normal. No ptosis. CN V: Facial sensation is intact to pinprick in all 3 divisions bilaterally. Corneal responses are intact.  CN VII: Face is symmetric with normal eye closure and smile. CN VIII: Hearing is normal to rubbing fingers CN IX, X: Palate elevates symmetrically. Phonation is normal. CN XI: Head turning and shoulder shrug are intact CN XII: Tongue is midline with normal movements and no atrophy.  MOTOR: There is no pronator drift of out-stretched arms. Muscle bulk and tone are normal. Muscle strength is normal.  REFLEXES: Reflexes are 2+ and symmetric at the biceps, triceps, knees, and absent at ankles. Plantar responses are flexor.  SENSORY: Length dependent decreased to light touch, pinprick and vibratory sensation at the ankle  COORDINATION: Rapid alternating movements and fine finger movements are intact. There is no dysmetria on finger-to-nose and heel-knee-shin.    GAIT/STANCE: Mildly antalgic, steady  DIAGNOSTIC DATA (LABS, IMAGING, TESTING) - I  reviewed patient records, labs, notes, testing and imaging myself where available.   ASSESSMENT AND PLAN  Malesha Suliman is a 62 y.o. female   Chronic migraine headaches Bilateral lower extremity paresthesia  Differentiation diagnosis including peripheral neuropathy, right lumbosacral radiculopathy  Proceed with MRI of lumbar  EMG nerve conduction study  Laboratory evaluations for etiology  Increase Lyrica to 50 mg up to 3 tablets every night  Levert Feinstein, M.D. Ph.D.  [facility] [address] [place], [place] [address] Ph: [phone] Fax: [phone]  CC: Ansel Bong, [place]

## 2017-12-18 ENCOUNTER — Telehealth: Payer: Self-pay | Admitting: Neurology

## 2017-12-18 NOTE — Telephone Encounter (Signed)
[employer] auth: NPR Ref # [id number]  lvm for pt to call back about schedule mri

## 2017-12-18 NOTE — Telephone Encounter (Signed)
Patient returned my call she is scheduled for 12/27/17 on the [facility] mobile unit.

## 2017-12-19 LAB — COMPREHENSIVE METABOLIC PANEL
A/G RATIO: 1.7 (ref 1.2–2.2)
ALT: 13 IU/L (ref 0–32)
AST: 18 IU/L (ref 0–40)
Albumin: 4.7 g/dL (ref 3.6–4.8)
Alkaline Phosphatase: 100 IU/L (ref 39–117)
BUN / CREAT RATIO: 9 — AB (ref 12–28)
BUN: 8 mg/dL (ref 8–27)
Bilirubin Total: 0.2 mg/dL (ref 0.0–1.2)
CALCIUM: 9.6 mg/dL (ref 8.7–10.3)
CO2: 26 mmol/L (ref 20–29)
Chloride: 101 mmol/L (ref 96–106)
Creatinine, Ser: 0.87 mg/dL (ref 0.57–1.00)
GFR calc Af Amer: 83 mL/min/{1.73_m2} (ref >59)
GFR, EST NON AFRICAN AMERICAN: 72 mL/min/{1.73_m2} (ref >59)
GLOBULIN, TOTAL: 2.8 g/dL (ref 1.5–4.5)
Glucose: 94 mg/dL (ref 65–99)
POTASSIUM: 3.7 mmol/L (ref 3.5–5.2)
SODIUM: 141 mmol/L (ref 134–144)
Total Protein: 7.5 g/dL (ref 6.0–8.5)

## 2017-12-19 LAB — PROTEIN ELECTROPHORESIS
A/G RATIO SPE: 1.1 (ref 0.7–1.7)
Albumin ELP: 3.9 g/dL (ref 2.9–4.4)
Alpha 1: 0.2 g/dL (ref 0.0–0.4)
Alpha 2: 0.7 g/dL (ref 0.4–1.0)
Beta: 1.5 g/dL — ABNORMAL HIGH (ref 0.7–1.3)
GAMMA GLOBULIN: 1.2 g/dL (ref 0.4–1.8)
GLOBULIN, TOTAL: 3.6 g/dL (ref 2.2–3.9)

## 2017-12-19 LAB — CBC
Hematocrit: 42.3 % (ref 34.0–46.6)
Hemoglobin: 14.9 g/dL (ref 11.1–15.9)
MCH: 32.7 pg (ref 26.6–33.0)
MCHC: 35.2 g/dL (ref 31.5–35.7)
MCV: 93 fL (ref 79–97)
PLATELETS: 186 10*3/uL (ref 150–379)
RBC: 4.56 x10E6/uL (ref 3.77–5.28)
RDW: 12.7 % (ref 12.3–15.4)
WBC: 6.2 10*3/uL (ref 3.4–10.8)

## 2017-12-19 LAB — VITAMIN B12: Vitamin B-12: >2000 pg/mL — ABNORMAL HIGH (ref 232–1245)

## 2017-12-19 LAB — VITAMIN D 25 HYDROXY (VIT D DEFICIENCY, FRACTURES): Vit D, 25-Hydroxy: 29.4 ng/mL — ABNORMAL LOW (ref 30.0–100.0)

## 2017-12-19 LAB — C-REACTIVE PROTEIN: CRP: 2.4 mg/L (ref 0.0–4.9)

## 2017-12-19 LAB — FOLATE: Folate: 12.6 ng/mL (ref >3.0)

## 2017-12-19 LAB — TSH: TSH: 0.829 u[IU]/mL (ref 0.450–4.500)

## 2017-12-19 LAB — CK: Total CK: 70 U/L (ref 24–173)

## 2017-12-19 LAB — SEDIMENTATION RATE: Sed Rate: 13 mm/hr (ref 0–40)

## 2017-12-19 LAB — ANA W/REFLEX: Anti Nuclear Antibody(ANA): NEGATIVE

## 2017-12-19 LAB — RPR: RPR: NONREACTIVE

## 2017-12-19 LAB — FERRITIN: FERRITIN: 90 ng/mL (ref 15–150)

## 2017-12-19 LAB — HGB A1C W/O EAG: Hgb A1c MFr Bld: 5.2 % (ref 4.8–5.6)

## 2017-12-20 NOTE — Telephone Encounter (Signed)
Patient called in stating she had to cancel her MRI because she had no ride here and didn't know when she will be able to have a ride she will call back when she is ready to schedule.

## 2017-12-22 ENCOUNTER — Ambulatory Visit (INDEPENDENT_AMBULATORY_CARE_PROVIDER_SITE_OTHER): Payer: Federal, State, Local not specified - PPO | Admitting: Neurology

## 2017-12-22 DIAGNOSIS — R202 Paresthesia of skin: Secondary | ICD-10-CM

## 2017-12-22 DIAGNOSIS — Z0289 Encounter for other administrative examinations: Secondary | ICD-10-CM

## 2017-12-22 DIAGNOSIS — M79609 Pain in unspecified limb: Secondary | ICD-10-CM

## 2017-12-22 MED ORDER — GABAPENTIN 300 MG PO CAPS: 600.0000 mg | ORAL_CAPSULE | Freq: Three times a day (TID) | ORAL | 11 refills | Status: AC

## 2017-12-22 NOTE — Procedures (Signed)
Full Name: Brandi Gutierrez Gender: Female MRN #: [id number] Date of Birth: 03-Dec-1955    Visit Date: 12/22/2017 08:57 Age: 62 Years 2 Months Old Examining Physician: Levert Feinstein, MD  Referring Physician: Terrace Arabia, MD History: 62 year old female, presented with bilateral feet paresthesia, intermittent cramping, right wrist discomfort, right first 3 finger paresthesia  Summary of the tests:  Nerve conduction study: Bilateral sural, superficial peroneal sensory responses normal.  Bilateral tibial, peroneal to EDB motor responses were normal. Bilateral ulnar sensory and motor responses were normal.  Bilateral median sensory responses were within normal limit.  Right median transcarpal tunnel showed 0.5 ms prolonged in comparison to ipsilateral ulnar mixed response.  Bilateral median motor responses were normal.  Electromyography: Selective needle examination of right lower extremity and right paralumbar sacral paraspinal muscles was performed.  There was no significant abnormality noted.   Conclusion: This is a slight abnormal study.  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  There is no evidence of large fiber peripheral neuropathy or left lumbar sacral radiculopathy.    ------------------------------- Levert Feinstein, M.D.  [facility] [address] [place], [place] [address] Tel: [phone] Fax: [phone]        [facility]    Nerve / Sites Muscle Latency Ref. Amplitude Ref. Rel Amp Segments Distance Velocity Ref. Area    ms ms mV mV %  cm m/s m/s mVms  L Median - APB     Wrist APB 3.4 ?4.4 8.5 ?4.0 100 Wrist - APB 7   37.5     Upper arm APB 6.9  8.5  100 Upper arm - Wrist 20 58 ?49 38.6  R Median - APB     Wrist APB 3.8 ?4.4 8.1 ?4.0 100 Wrist - APB 7   31.2     Upper arm APB 7.3  8.0  97.9 Upper arm - Wrist 20 56 ?49 32.0  L Ulnar - ADM     Wrist ADM 2.7 ?3.3 10.3 ?6.0 100 Wrist -  ADM 7   39.0     B.Elbow ADM 5.2  9.7  94.2 B.Elbow - Wrist 17 67 ?49 39.4     A.Elbow ADM 6.9  8.8  91.1 A.Elbow - B.Elbow 10 58 ?49 34.2         A.Elbow - Wrist      R Ulnar - ADM     Wrist ADM 2.6 ?3.3 11.0 ?6.0 100 Wrist - ADM 7   42.2     B.Elbow ADM 5.5  9.8  89.2 B.Elbow - Wrist 17 58 ?49 38.0     A.Elbow ADM 7.2  10.3  104 A.Elbow - B.Elbow 10 56 ?49 40.5         A.Elbow - Wrist      L Peroneal - EDB     Ankle EDB 4.7 ?6.5 7.0 ?2.0 100 Ankle - EDB 9   23.3     Fib head EDB 11.5  6.9  99.1 Fib head - Ankle 32 47 ?44 25.1     Pop fossa EDB 13.5  6.1  88.5 Pop fossa - Fib head 10 49 ?44 22.6         Pop fossa - Ankle      R Peroneal - EDB     Ankle EDB 4.7 ?6.5 6.3 ?2.0 100 Ankle - EDB 9   26.1     Fib head EDB 10.6  5.8  91.7 Fib head - Ankle 32 54 ?44 24.8     Pop fossa EDB 12.5  5.6  96.9 Pop fossa - Fib head 10 53 ?44 23.5         Pop fossa - Ankle      L Tibial - AH     Ankle AH 4.1 ?5.8 8.4 ?4.0 100 Ankle - AH 9   31.9     Pop fossa AH 12.1  6.7  80.7 Pop fossa - Ankle 37 46 ?41 32.0  R Tibial - AH     Ankle AH 4.0 ?5.8 10.4 ?4.0 100 Ankle - AH 9   27.2     Pop fossa AH 12.4  9.1  87.5 Pop fossa - Ankle 37 44 ?41 26.3                     SNC    Nerve / Sites Rec. Site Peak Lat Ref.  Amp Ref. Segments Distance Peak Diff Ref.    ms ms V V  cm ms ms  L Sural - Ankle (Calf)     Calf Ankle 4.0 ?4.4 17 ?6 Calf - Ankle 14    R Sural - Ankle (Calf)     Calf Ankle 3.9 ?4.4 18 ?6 Calf - Ankle 14    L Superficial peroneal - Ankle     Lat leg Ankle 4.1 ?4.4 6 ?6 Lat leg - Ankle 14    R Superficial peroneal - Ankle     Lat leg Ankle 4.0 ?4.4 7 ?6 Lat leg - Ankle 14    L Median, Ulnar - Transcarpal comparison     Median Palm Wrist 2.0 ?2.2 61 ?35 Median Palm - Wrist 8       Ulnar Palm Wrist 2.1 ?2.2 23 ?12 Ulnar Palm - Wrist 8          Median Palm - Ulnar Palm  -0.1 ?0.4  R Median, Ulnar - Transcarpal comparison     Median Palm Wrist 2.2 ?2.2 45 ?35 Median Palm - Wrist 8         Ulnar Palm Wrist 1.8 ?2.2 28 ?12 Ulnar Palm - Wrist 8          Median Palm - Ulnar Palm  0.5 ?0.4  L Median - Orthodromic (Dig II, Mid palm)     Dig II Wrist 2.9 ?3.4 16 ?10 Dig II - Wrist 13    R Median - Orthodromic (Dig II, Mid palm)     Dig II Wrist 3.0 ?3.4 12 ?10 Dig II - Wrist 13    L Ulnar - Orthodromic, (Dig V, Mid palm)     Dig V Wrist 2.8 ?3.1 16 ?5 Dig V - Wrist 11    R Ulnar - Orthodromic, (Dig V, Mid palm)     Dig V Wrist 2.5 ?3.1 21 ?5 Dig V - Wrist 4111                           F  Wave    Nerve F Lat Ref.   ms ms  L Tibial - AH 47.4 ?56.0  R Tibial - AH 47.9 ?56.0  L Ulnar - ADM 24.8 ?32.0  R Ulnar - ADM 24.6 ?32.0             EMG full       EMG Summary Table    Spontaneous MUAP Recruitment  Muscle  IA Fib PSW Fasc Other Amp Dur. Poly Pattern  R. Tibialis anterior Normal None None None _______ Normal Normal Normal Normal  R. Peroneus longus Normal None None None _______ Normal Normal Normal Normal  R. Gastrocnemius (Medial head) Normal None None None _______ Normal Normal Normal Normal  R. Biceps femoris (long head) Normal None None None _______ Normal Normal Normal Normal  R. Lumbar paraspinals (mid) Normal None None None _______ Normal Normal Normal Normal  R. Lumbar paraspinals (low) Normal None None None _______ Normal Normal Normal Normal

## 2017-12-27 ENCOUNTER — Other Ambulatory Visit: Payer: Federal, State, Local not specified - PPO

## 2018-01-24 ENCOUNTER — Other Ambulatory Visit: Payer: Federal, State, Local not specified - PPO

## 2018-03-08 ENCOUNTER — Ambulatory Visit: Payer: Federal, State, Local not specified - PPO | Admitting: Neurology

## 2018-10-17 ENCOUNTER — Other Ambulatory Visit: Payer: Self-pay | Admitting: Neurology

## 2019-07-26 ENCOUNTER — Other Ambulatory Visit (HOSPITAL_BASED_OUTPATIENT_CLINIC_OR_DEPARTMENT_OTHER): Payer: Self-pay | Admitting: Physician Assistant

## 2019-07-26 DIAGNOSIS — Z1231 Encounter for screening mammogram for malignant neoplasm of breast: Secondary | ICD-10-CM

## 2019-08-01 ENCOUNTER — Other Ambulatory Visit: Payer: Self-pay

## 2019-08-01 ENCOUNTER — Ambulatory Visit (HOSPITAL_BASED_OUTPATIENT_CLINIC_OR_DEPARTMENT_OTHER)
Admission: RE | Admit: 2019-08-01 | Discharge: 2019-08-01 | Disposition: A | Discharge status: Home / Self Care | Payer: Federal, State, Local not specified - PPO | Source: Ambulatory Visit | Attending: Physician Assistant | Admitting: Physician Assistant

## 2019-08-01 DIAGNOSIS — Z1231 Encounter for screening mammogram for malignant neoplasm of breast: Secondary | ICD-10-CM

## 2019-08-05 ENCOUNTER — Other Ambulatory Visit: Payer: Self-pay | Admitting: Physician Assistant

## 2019-08-05 DIAGNOSIS — R928 Other abnormal and inconclusive findings on diagnostic imaging of breast: Secondary | ICD-10-CM

## 2019-08-08 ENCOUNTER — Ambulatory Visit
Admission: RE | Admit: 2019-08-08 | Discharge: 2019-08-08 | Disposition: A | Discharge status: Home / Self Care | Payer: Federal, State, Local not specified - PPO | Source: Ambulatory Visit | Attending: Physician Assistant | Admitting: Physician Assistant

## 2019-08-08 ENCOUNTER — Other Ambulatory Visit: Payer: Self-pay

## 2019-08-08 DIAGNOSIS — R928 Other abnormal and inconclusive findings on diagnostic imaging of breast: Secondary | ICD-10-CM

## 2020-08-05 ENCOUNTER — Other Ambulatory Visit: Payer: Self-pay | Admitting: Internal Medicine

## 2020-08-05 DIAGNOSIS — Z1231 Encounter for screening mammogram for malignant neoplasm of breast: Secondary | ICD-10-CM

## 2020-09-16 ENCOUNTER — Other Ambulatory Visit: Payer: Self-pay

## 2020-09-16 ENCOUNTER — Ambulatory Visit
Admission: RE | Admit: 2020-09-16 | Discharge: 2020-09-16 | Disposition: A | Discharge status: Home / Self Care | Payer: Federal, State, Local not specified - PPO | Source: Ambulatory Visit | Attending: Internal Medicine | Admitting: Internal Medicine

## 2020-09-16 DIAGNOSIS — Z1231 Encounter for screening mammogram for malignant neoplasm of breast: Secondary | ICD-10-CM

## 2022-05-14 IMAGING — MG DIGITAL SCREENING BILAT W/ TOMO W/ CAD
8 series · 9 of 24 positions shown · non-contrast
Comparison: Previous exam(s).

CLINICAL DATA: Screening.

EXAM:
DIGITAL SCREENING BILATERAL MAMMOGRAM WITH TOMO AND CAD

[R MLO synth-2D]
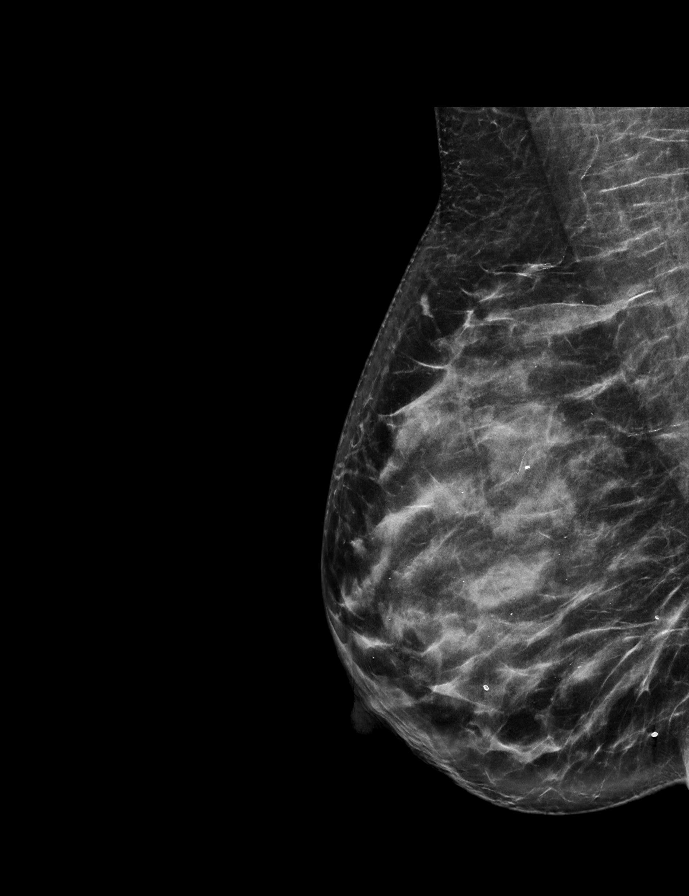

[L CC synth-2D]
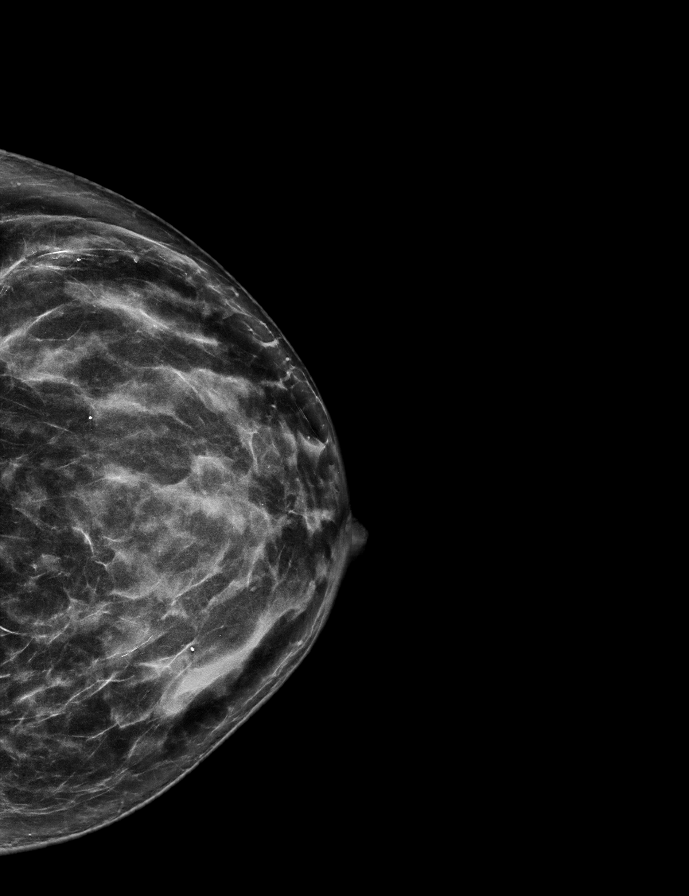

[L MLO synth-2D]
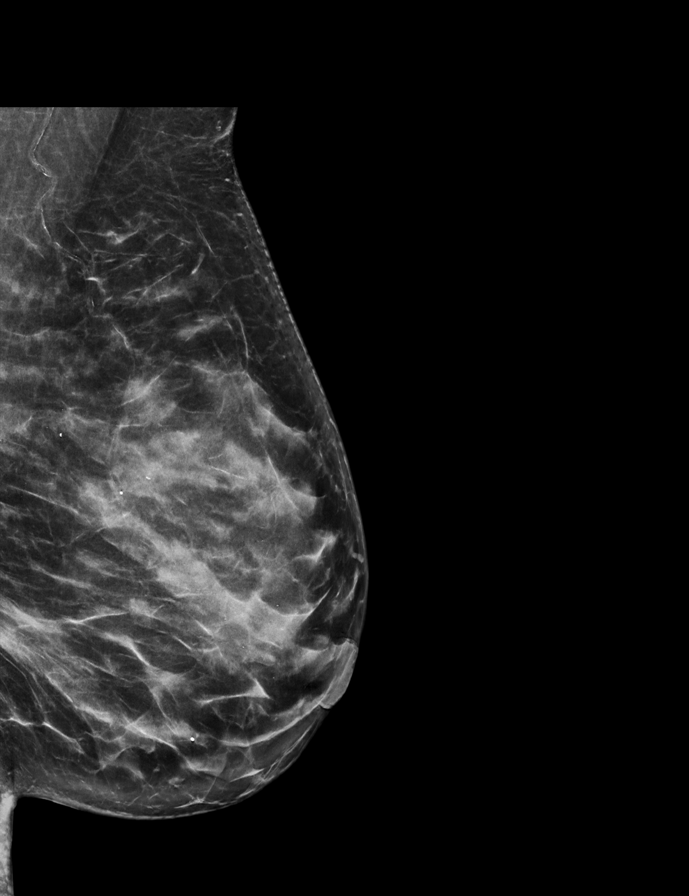

[R CC synth-2D]
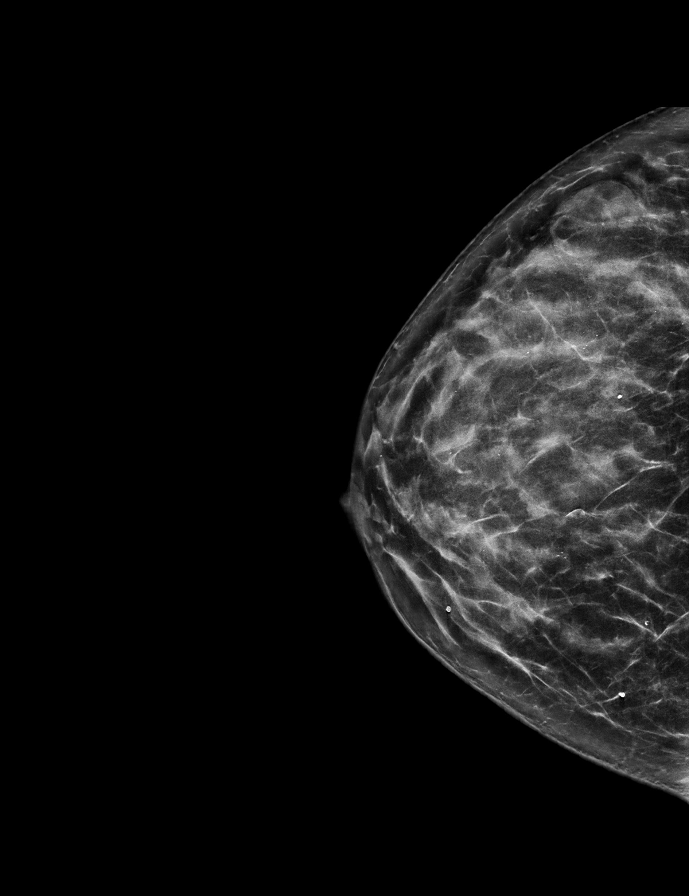

[R MLO tomo · 2 of 72 frames shown]
[frame 24/72]
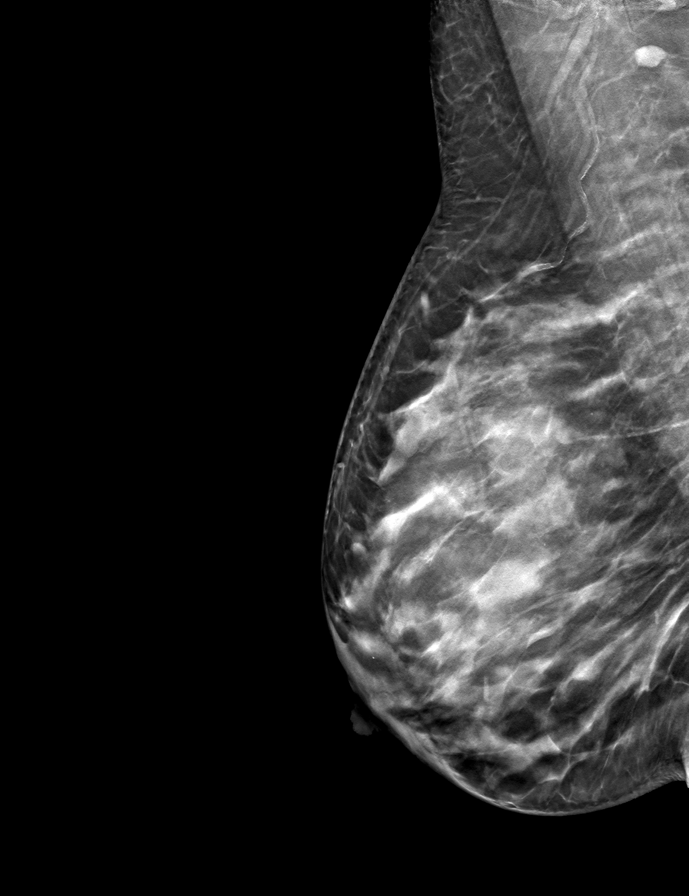
[frame 37/72]
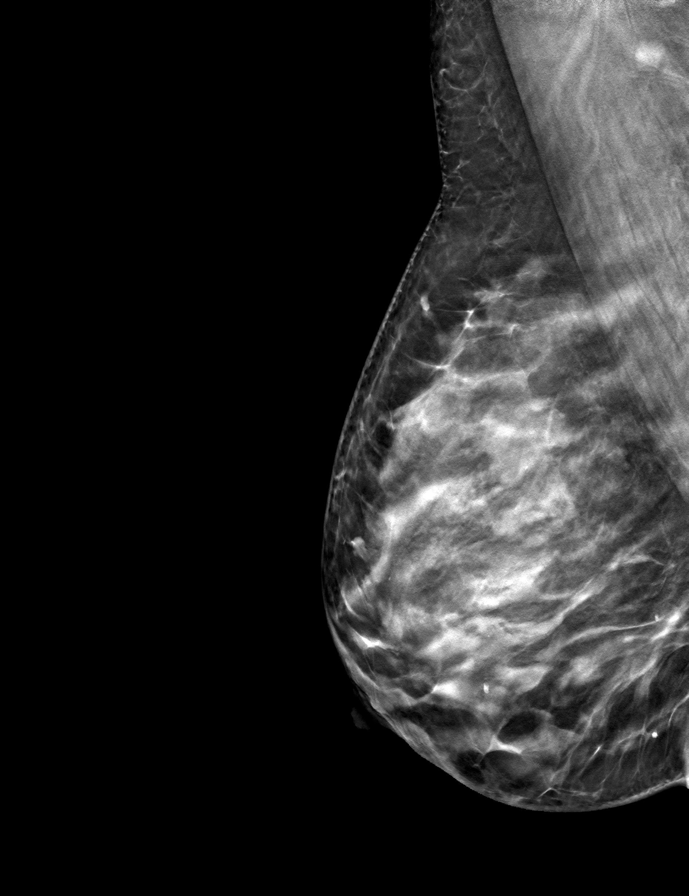

[L MLO tomo · tomo slice 35/70.0]
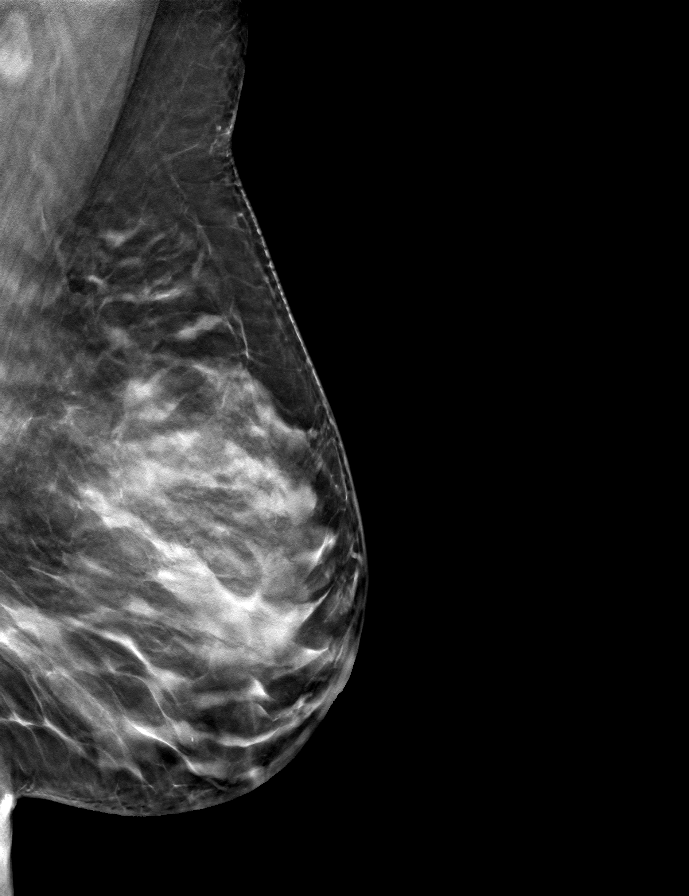

[R CC tomo · tomo slice 31/62.0]
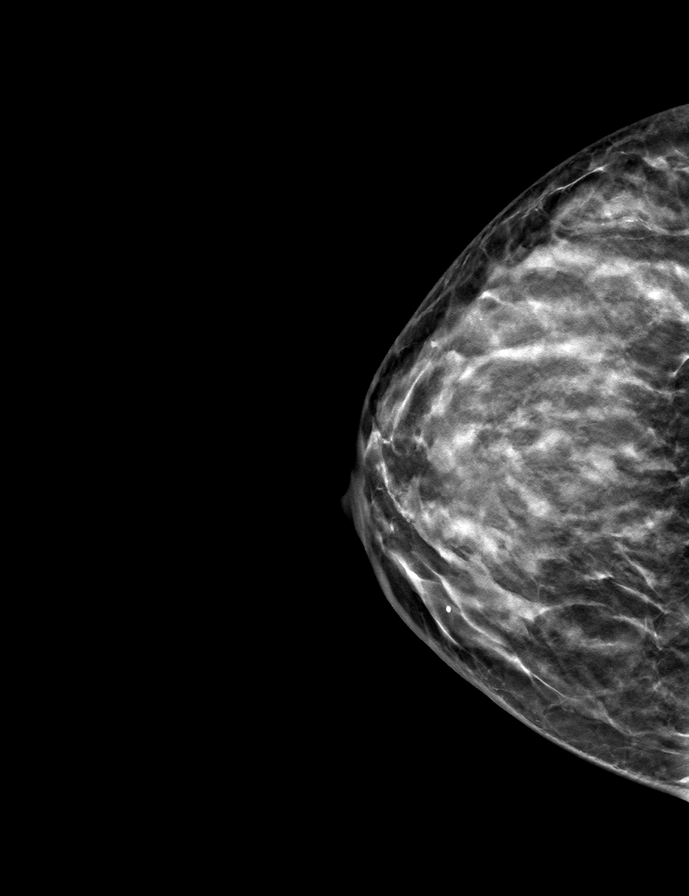

[L CC tomo · tomo slice 33/65.0]
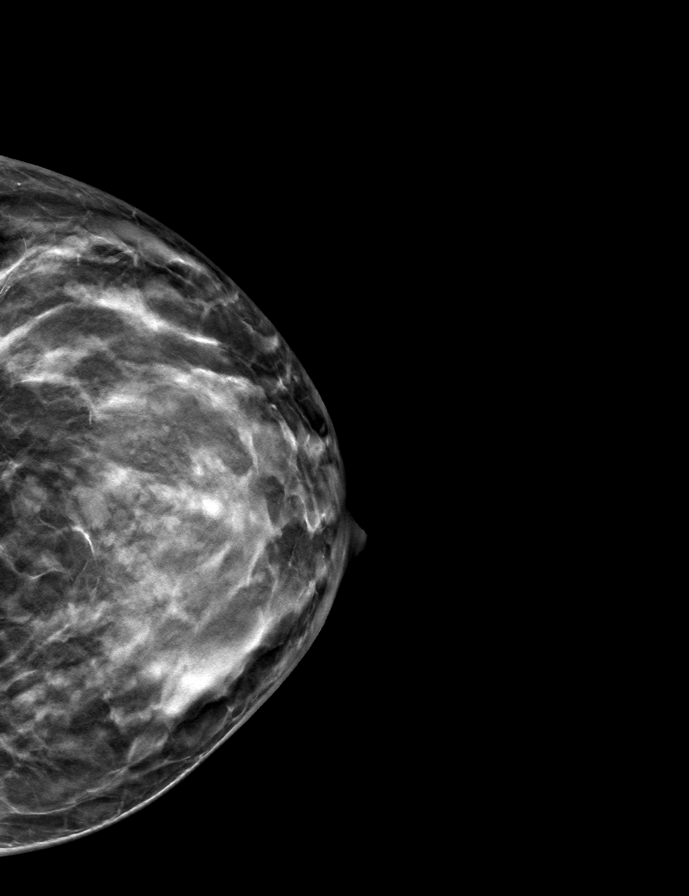

[9 of 24 positions shown; findings below may reference images not displayed]

ACR Breast Density Category c: The breast tissue is heterogeneously
dense, which may obscure small masses.
FINDINGS: There are no findings suspicious for malignancy. Images were
processed with CAD.
IMPRESSION: No mammographic evidence of malignancy. A result letter of this
screening mammogram will be mailed directly to the patient.

RECOMMENDATION:
Screening mammogram in one year. (Code:[id number])

BI-RADS CATEGORY  1: Negative.
